# Patient Record
Sex: Female | Born: 1935 | Race: White | Hispanic: No | Smoking: Never smoker
Health system: Southern US, Community
[De-identification: ages and names within clinical notes are randomized; demographics above are authoritative.]

## PROBLEM LIST (undated history)

## (undated) DIAGNOSIS — I10 Essential (primary) hypertension: Secondary | ICD-10-CM

## (undated) DIAGNOSIS — N200 Calculus of kidney: Secondary | ICD-10-CM

## (undated) DIAGNOSIS — B029 Zoster without complications: Secondary | ICD-10-CM

## (undated) DIAGNOSIS — H409 Unspecified glaucoma: Secondary | ICD-10-CM

## (undated) HISTORY — PX: BIOPSY THYROID: PRO38

## (undated) HISTORY — PX: HEMORROIDECTOMY: SUR656

## (undated) HISTORY — PX: OTHER SURGICAL HISTORY: SHX169

## (undated) HISTORY — PX: CYST EXCISION: SHX5701

## (undated) HISTORY — PX: THYROID SURGERY: SHX805

## (undated) HISTORY — PX: ABDOMINAL HYSTERECTOMY: SHX81

## (undated) HISTORY — PX: LUNG BIOPSY: SHX232

## (undated) HISTORY — PX: CATARACT EXTRACTION: SUR2

## (undated) HISTORY — PX: VEIN SURGERY: SHX48

## (undated) HISTORY — PX: CHOLECYSTECTOMY: SHX55

## (undated) HISTORY — PX: PARTIAL HYSTERECTOMY: SHX80

## (undated) HISTORY — PX: ROTATOR CUFF REPAIR: SHX139

---

## 2010-04-11 ENCOUNTER — Inpatient Hospital Stay (HOSPITAL_COMMUNITY)
Admission: EM | Admit: 2010-04-11 | Discharge: 2010-04-15 | DRG: 603 | Disposition: A | Discharge status: Home / Self Care | Payer: Medicare Other | Attending: Internal Medicine | Admitting: Internal Medicine

## 2010-04-11 ENCOUNTER — Inpatient Hospital Stay (HOSPITAL_COMMUNITY): Payer: Medicare Other

## 2010-04-11 DIAGNOSIS — I10 Essential (primary) hypertension: Secondary | ICD-10-CM | POA: Diagnosis present

## 2010-04-11 DIAGNOSIS — T368X5A Adverse effect of other systemic antibiotics, initial encounter: Secondary | ICD-10-CM | POA: Diagnosis present

## 2010-04-11 DIAGNOSIS — E876 Hypokalemia: Secondary | ICD-10-CM | POA: Diagnosis present

## 2010-04-11 DIAGNOSIS — L03319 Cellulitis of trunk, unspecified: Principal | ICD-10-CM | POA: Diagnosis present

## 2010-04-11 DIAGNOSIS — M109 Gout, unspecified: Secondary | ICD-10-CM | POA: Diagnosis not present

## 2010-04-11 DIAGNOSIS — H409 Unspecified glaucoma: Secondary | ICD-10-CM | POA: Diagnosis present

## 2010-04-11 DIAGNOSIS — R232 Flushing: Secondary | ICD-10-CM | POA: Diagnosis present

## 2010-04-11 DIAGNOSIS — L02219 Cutaneous abscess of trunk, unspecified: Principal | ICD-10-CM | POA: Diagnosis present

## 2010-04-11 LAB — BASIC METABOLIC PANEL
Chloride: 104 mEq/L (ref 96–112)
GFR calc Af Amer: 58 mL/min — ABNORMAL LOW (ref >60)
Potassium: 3.3 mEq/L — ABNORMAL LOW (ref 3.5–5.1)
Sodium: 139 mEq/L (ref 135–145)

## 2010-04-11 LAB — CBC
Platelets: 228 10*3/uL (ref 150–400)
RBC: 4.49 MIL/uL (ref 3.87–5.11)
WBC: 9.2 10*3/uL (ref 4.0–10.5)

## 2010-04-11 LAB — DIFFERENTIAL
Basophils Relative: 0 % (ref 0–1)
Eosinophils Absolute: 0.2 10*3/uL (ref 0.0–0.7)
Neutro Abs: 6.4 10*3/uL (ref 1.7–7.7)
Neutrophils Relative %: 70 % (ref 43–77)

## 2010-04-12 ENCOUNTER — Inpatient Hospital Stay (HOSPITAL_COMMUNITY): Payer: Medicare Other

## 2010-04-12 LAB — DIFFERENTIAL
Basophils Relative: 0 % (ref 0–1)
Lymphocytes Relative: 19 % (ref 12–46)
Lymphs Abs: 1.7 10*3/uL (ref 0.7–4.0)
Monocytes Relative: 13 % — ABNORMAL HIGH (ref 3–12)
Neutro Abs: 5.7 10*3/uL (ref 1.7–7.7)
Neutrophils Relative %: 67 % (ref 43–77)

## 2010-04-12 LAB — CBC
Hemoglobin: 11.5 g/dL — ABNORMAL LOW (ref 12.0–15.0)
MCH: 27.6 pg (ref 26.0–34.0)
MCV: 83 fL (ref 78.0–100.0)
RBC: 4.17 MIL/uL (ref 3.87–5.11)

## 2010-04-12 LAB — COMPREHENSIVE METABOLIC PANEL
AST: 15 U/L (ref 0–37)
CO2: 24 mEq/L (ref 19–32)
Chloride: 104 mEq/L (ref 96–112)
Creatinine, Ser: 1.04 mg/dL (ref 0.4–1.2)
GFR calc Af Amer: >60 mL/min (ref >60)
GFR calc non Af Amer: 52 mL/min — ABNORMAL LOW (ref >60)
Total Bilirubin: 0.9 mg/dL (ref 0.3–1.2)

## 2010-04-12 LAB — HEMOGLOBIN A1C: Mean Plasma Glucose: 131 mg/dL — ABNORMAL HIGH (ref <117)

## 2010-04-13 ENCOUNTER — Inpatient Hospital Stay (HOSPITAL_COMMUNITY): Payer: Medicare Other

## 2010-04-13 LAB — DIFFERENTIAL
Eosinophils Absolute: 0.3 10*3/uL (ref 0.0–0.7)
Eosinophils Relative: 5 % (ref 0–5)
Lymphs Abs: 1.6 10*3/uL (ref 0.7–4.0)
Monocytes Relative: 10 % (ref 3–12)

## 2010-04-13 LAB — BASIC METABOLIC PANEL
BUN: 10 mg/dL (ref 6–23)
Creatinine, Ser: 1.08 mg/dL (ref 0.4–1.2)
GFR calc non Af Amer: 49 mL/min — ABNORMAL LOW (ref >60)

## 2010-04-13 LAB — CBC
MCH: 27.4 pg (ref 26.0–34.0)
MCHC: 33 g/dL (ref 30.0–36.0)
MCV: 82.9 fL (ref 78.0–100.0)
Platelets: 170 10*3/uL (ref 150–400)
RDW: 14 % (ref 11.5–15.5)

## 2010-04-13 LAB — BRAIN NATRIURETIC PEPTIDE: Pro B Natriuretic peptide (BNP): 63.5 pg/mL (ref 0.0–100.0)

## 2010-04-18 NOTE — H&P (Signed)
NAME:  April Bailey, April Bailey            ACCOUNT NO.:  [id number]  MEDICAL RECORD NO.:  [id number]           PATIENT TYPE:  E  LOCATION:  [facility]                          FACILITY:  [facility]  PHYSICIAN:  Eduard Clos, MDDATE OF BIRTH:  01/23/1932  DATE OF ADMISSION:  04/11/2010 DATE OF DISCHARGE:  LH                             HISTORY & PHYSICAL   PRIMARY CARE PHYSICIAN:  [facility].  CHIEF COMPLAINT:  Abdominal wall swelling and pain.  HISTORY OF PRESENT ILLNESS:  A 77 year old female with a history of hypertension, glaucoma, has been experiencing some swelling in her abdomen around the umbilicus, infraumbilical, since last 3 days which has been worsening.  The patient denies any trauma or insect bite, has been having some subjective feeling of fever, chills.  Denies any nausea, vomiting.  Denies any deep abdominal pain or diarrhea.  At this time, the patient has been admitted for abdominal wall cellulitis.  During the ER, the patient did develop some reaction to vancomycin for which Benadryl was given and the patient at this time is asymptomatic. The rash was in the face, and as per the nurse the patient was looking flushed.  The patient did not have any swelling of the tongue or difficulty breathing during the reaction.  The patient denies any chest pain, shortness of breath.  Denies any nausea, vomiting, dysuria, discharges, diarrhea, any focal deficit, headache or visual symptoms, hypertension, glaucoma.  PAST SURGICAL HISTORY:  Cholecystectomy.  MEDICATIONS PRIOR TO ADMISSION: 1. Timolol. 2. Maleate eye drops. 3. Travatan eye drops. 4. Benazepril 40 mg daily. 5. Amlodipine 10 mg daily. 6. Vitamin C. 7. Folic acid. 8. B12. 9. Flaxseed oil. 10.Cod liver. 11.Garlic. 12.Potassium chloride. 13.Hydrochlorothiazide 25 mg daily.  ALLERGIES:  IV DYE, IBUPROFEN, and LEVAQUIN.  FAMILY HISTORY:  Nothing contributory.  SOCIAL HISTORY:  The patient lives alone but is  frequented by her daughter and son.  Her daughter was here in the ER along with the patient.  The patient denies smoking cigarettes, drinking alcohol, using illegal drugs.  Full code.  REVIEW OF SYSTEMS:  As per the history of present illness, nothing else significant.  PHYSICAL EXAMINATION:  GENERAL:  The patient examined at bedside not in acute distress. VITAL SIGNS:  Blood pressure is 127/70, pulse 89 per minute, temperature 99.9, respiration is 20 per minute, O2 sat 98%. HEENT:  Anicteric.  No pallor.  No discharge from ears, eyes, nose, or mouth. CHEST:  Bilateral air entry present.  No crepitation. HEART:  S1 and S2 heard. ABDOMEN:  Soft, mild tenderness around the indurated area.  There is a 2- cm duration area in the left infraumbilical area with some erythema extending up to 10 cm in diameter.  I do not see any active discharge at this time.  Bowel sounds are present. CNS:  Alert, awake, oriented to time, place, and person.  Moves upper and lower extremities 5/5. EXTREMITIES:  Peripheral pulses felt.  No edema. LABORATORY DATA:  CBC, WBC 9.2, hemoglobin is 12.5, hematocrit 37.1, platelets 228,000.  Basic metabolic panel, sodium 139, potassium 3.3, chloride 104, carbon dioxide 24, glucose 110, BUN 21, creatinine 1.1, calcium  10.  ASSESSMENT: 1. Abdominal wall cellulitis. 2. Red man reaction to vancomycin. 3. Hypertension. 4. Glaucoma.  PLAN: 1. At this time, admit the patient to medical floor. 2. For abdominal wall cellulitis, we will continue with vancomycin.  I     am going to add Zosyn and clindamycin, get a CT abdomen and pelvis     without contrast to check for developed of abscess or any features     of fascitis.  At that time, we need to consult Surgery. 3. For her red man reaction, we will instruct pharmacy to infuse     vancomycin slowly.  We will give Benadryl 25 before each dose of     vancomycin. 4. Hypertension.  Continue present medication and  further     recommendation as condition evolves.     Eduard Clos, MD     ANK/MEDQ  D:  04/11/2010  T:  04/11/2010  Job:  [id number]  Electronically Signed by Midge Minium MD on 04/18/2010 08:25:54 AM

## 2010-05-04 NOTE — Discharge Summary (Signed)
NAME:  April Bailey, April Bailey            ACCOUNT NO.:  [id number]  MEDICAL RECORD NO.:  [id number]           PATIENT TYPE:  I  LOCATION:  [facility]                          FACILITY:  [facility]  PHYSICIAN:  Jameson Tormey L. Lendell Bailey, MDDATE OF BIRTH:  31-Dec-1932  DATE OF ADMISSION:  04/11/2010 DATE OF DISCHARGE:  03/30/2012LH                              DISCHARGE SUMMARY   DISCHARGE DIAGNOSES: 1. Abdominal wall cellulitis. 2. Acute gout attack of the left foot. 3. Hypertension. 4. Glaucoma. 5. Known history of pulmonary histoplasmosis.  DISCHARGE MEDICATIONS: 1. Doxycycline 100 mg p.o. b.i.d. until gone. 2. Colchicine 1-3 tablets every 2 hours as needed for gout. 3. Imodium as needed for diarrhea. 4. Phenergan 12.5 mg p.o. q.6 h. p.r.n. nausea. 5. Antibacterial ointment to the open area near her umbilicus twice     daily until healed, cover with bandage. 6. Continue amlodipine 10 mg a day. 7. Azopt ophthalmic suspension both eyes twice a day. 8. Benazepril 40 mg a day. 9. Calcium carbonate with vitamin D 1200 mg twice a day. 10.Flaxseed oil twice a day. 11.Folic acid 800 mg a day. 12.Garlic 1000 mg twice a day. 13.Hydrochlorothiazide 25 mg a day. 14.Klor-Con 20 mEq a day. 15.Timolol ophthalmic solution 0.5% every morning. 16.Travatan ophthalmic solution 0.004% 1 drop both eyes nightly. 17.Vitamin B12 daily. 18.Vitamin C 500 mg daily.  CONDITION:  Stable.  ACTIVITY:  Ad lib.  DIET:  Should be heart healthy.  CONSULTATIONS:  None.  PROCEDURES:  None.  FOLLOWUP:  Dr. Jonelle Sports next week.  LABORATORY DATA:  CBC normal.  Basic metabolic panel significant for potassium of 3.3 which was repleted.  Liver function tests unremarkable. Magnesium normal.  Hemoglobin A1c is 6.2.  TSH, free T4, and BNP normal. X-ray of her left foot showed erosions involving the head of the first metatarsal, consider gout, nothing acute.  CT of the abdomen and pelvis without contrast showed periumbilical  cellulitis without definite abscess, prominent common bile duct question due to previous cholecystectomy, hiatal hernia, diverticulosis, question of hepatic cyst, innumerable less than 5 mm diameter nodules at the lung bases.  CT of the chest without contrast showed bilateral innumerable 3-5 mm pulmonary nodules scattered diffusely with some previous scarring.  HISTORY AND HOSPITAL COURSE:  Please see H and P for details.  April Bailey is a pleasant 77 year old white female from [place], [place] who presented to the emergency room with pain and swelling near her umbilicus which had been worsening over the past days.  The patient in the emergency room had a temperature of 99.9, otherwise normal vital signs.  She had an indurated area just left and below umbilicus with erythema extending 10 cm in diameter, no drainage or wound.  She had a normal white count.  She was given vancomycin in the emergency room and had a red man reaction, but no true allergic reaction.  She was placed on ceftaroline and clindamycin.  Her infection did improve and her cellulitis is nearly resolved.  She did develop a small wound just below her umbilicus that was draining serous fluid without any foul odor.  At the time of discharge, there was minimal drainage.  Her cellulitis was nearly resolved and she was feeling much better.  During the hospitalization, she developed left foot erythema, warmth, and exquisite tenderness.  She has history of gout in her hand previously.  She had not injured her foot.  X-ray was consistent with chronic gout.  Her uric acid level was normal.  She is allergic to NONSTEROIDAL ANTI-INFLAMMATORIES.  She was given a couple of doses of colchicine and her pain is much improved.  She had developed some diarrhea after the colchicine and we will give her Imodium.  Her other medical problems remained stable during the hospitalization. Total time on the day of discharge was greater than  30 minutes.     April Hitchman L. Lendell Caprice, MD     CLS/MEDQ  D:  04/15/2010  T:  04/16/2010  Job:  [id number]  cc:   Dr. Jonelle Sports Fax: [phone]  Electronically Signed by Crista Curb MD on 05/04/2010 09:31:54 PM

## 2014-01-17 ENCOUNTER — Encounter (HOSPITAL_COMMUNITY): Payer: Self-pay | Admitting: Emergency Medicine

## 2014-01-17 ENCOUNTER — Emergency Department (HOSPITAL_COMMUNITY): Payer: Medicare Other

## 2014-01-17 ENCOUNTER — Emergency Department (HOSPITAL_COMMUNITY)
Admission: EM | Admit: 2014-01-17 | Discharge: 2014-01-17 | Disposition: A | Discharge status: Home / Self Care | Payer: Medicare Other | Attending: Emergency Medicine | Admitting: Emergency Medicine

## 2014-01-17 DIAGNOSIS — R51 Headache: Secondary | ICD-10-CM | POA: Insufficient documentation

## 2014-01-17 DIAGNOSIS — Z8669 Personal history of other diseases of the nervous system and sense organs: Secondary | ICD-10-CM | POA: Insufficient documentation

## 2014-01-17 DIAGNOSIS — Z79899 Other long term (current) drug therapy: Secondary | ICD-10-CM | POA: Diagnosis not present

## 2014-01-17 DIAGNOSIS — R519 Headache, unspecified: Secondary | ICD-10-CM

## 2014-01-17 DIAGNOSIS — Z87442 Personal history of urinary calculi: Secondary | ICD-10-CM | POA: Diagnosis not present

## 2014-01-17 DIAGNOSIS — I10 Essential (primary) hypertension: Secondary | ICD-10-CM | POA: Insufficient documentation

## 2014-01-17 HISTORY — DX: Essential (primary) hypertension: I10

## 2014-01-17 HISTORY — DX: Unspecified glaucoma: H40.9

## 2014-01-17 HISTORY — DX: Calculus of kidney: N20.0

## 2014-01-17 LAB — URINE MICROSCOPIC-ADD ON

## 2014-01-17 LAB — BASIC METABOLIC PANEL
ANION GAP: 7 (ref 5–15)
BUN: 18 mg/dL (ref 6–23)
CHLORIDE: 105 meq/L (ref 96–112)
CO2: 24 mmol/L (ref 19–32)
Calcium: 10.1 mg/dL (ref 8.4–10.5)
Creatinine, Ser: 1.05 mg/dL (ref 0.50–1.10)
GFR, EST AFRICAN AMERICAN: 57 mL/min — AB (ref >90)
GFR, EST NON AFRICAN AMERICAN: 49 mL/min — AB (ref >90)
Glucose, Bld: 139 mg/dL — ABNORMAL HIGH (ref 70–99)
POTASSIUM: 3.3 mmol/L — AB (ref 3.5–5.1)
SODIUM: 136 mmol/L (ref 135–145)

## 2014-01-17 LAB — CBC WITH DIFFERENTIAL/PLATELET
BASOS PCT: 0 % (ref 0–1)
Basophils Absolute: 0 10*3/uL (ref 0.0–0.1)
EOS ABS: 0.2 10*3/uL (ref 0.0–0.7)
Eosinophils Relative: 2 % (ref 0–5)
HCT: 38.5 % (ref 36.0–46.0)
Hemoglobin: 12.5 g/dL (ref 12.0–15.0)
LYMPHS ABS: 1.1 10*3/uL (ref 0.7–4.0)
Lymphocytes Relative: 12 % (ref 12–46)
MCH: 26.9 pg (ref 26.0–34.0)
MCHC: 32.5 g/dL (ref 30.0–36.0)
MCV: 82.8 fL (ref 78.0–100.0)
Monocytes Absolute: 0.6 10*3/uL (ref 0.1–1.0)
Monocytes Relative: 7 % (ref 3–12)
NEUTROS ABS: 7.5 10*3/uL (ref 1.7–7.7)
NEUTROS PCT: 79 % — AB (ref 43–77)
PLATELETS: 248 10*3/uL (ref 150–400)
RBC: 4.65 MIL/uL (ref 3.87–5.11)
RDW: 14.8 % (ref 11.5–15.5)
WBC: 9.4 10*3/uL (ref 4.0–10.5)

## 2014-01-17 LAB — URINALYSIS, ROUTINE W REFLEX MICROSCOPIC
Bilirubin Urine: NEGATIVE
GLUCOSE, UA: NEGATIVE mg/dL
Hgb urine dipstick: NEGATIVE
Ketones, ur: NEGATIVE mg/dL
Nitrite: NEGATIVE
PROTEIN: NEGATIVE mg/dL
SPECIFIC GRAVITY, URINE: 1.01 (ref 1.005–1.030)
UROBILINOGEN UA: 0.2 mg/dL (ref 0.0–1.0)
pH: 6 (ref 5.0–8.0)

## 2014-01-17 LAB — SEDIMENTATION RATE: SED RATE: 8 mm/h (ref 0–22)

## 2014-01-17 MED ORDER — POTASSIUM CHLORIDE CRYS ER 20 MEQ PO TBCR
40.0000 meq | EXTENDED_RELEASE_TABLET | Freq: Once | ORAL | Status: AC
  Administered 2014-01-17: 40 meq via ORAL
  Filled 2014-01-17: qty 2

## 2014-01-17 MED ORDER — HYDROCODONE-ACETAMINOPHEN 5-325 MG PO TABS: 1.0000 | ORAL_TABLET | ORAL | Status: DC | PRN

## 2014-01-17 MED ORDER — MORPHINE SULFATE 4 MG/ML IJ SOLN
4.0000 mg | Freq: Once | INTRAMUSCULAR | Status: AC
  Administered 2014-01-17: 4 mg via INTRAVENOUS
  Filled 2014-01-17: qty 1

## 2014-01-17 MED ORDER — ONDANSETRON HCL 4 MG/2ML IJ SOLN
4.0000 mg | Freq: Once | INTRAMUSCULAR | Status: AC
  Administered 2014-01-17: 4 mg via INTRAMUSCULAR
  Filled 2014-01-17: qty 2

## 2014-01-17 MED ORDER — MORPHINE SULFATE 4 MG/ML IJ SOLN
2.0000 mg | Freq: Once | INTRAMUSCULAR | Status: AC
  Administered 2014-01-17: 2 mg via INTRAVENOUS
  Filled 2014-01-17: qty 1

## 2014-01-17 MED ORDER — METOCLOPRAMIDE HCL 5 MG/ML IJ SOLN
5.0000 mg | Freq: Once | INTRAMUSCULAR | Status: AC
  Administered 2014-01-17: 5 mg via INTRAVENOUS
  Filled 2014-01-17: qty 2

## 2014-01-17 NOTE — ED Notes (Signed)
Rn called CT, Peyton Najjar and requested copy of CT disc and report for pt.

## 2014-01-17 NOTE — Discharge Instructions (Signed)

## 2014-01-17 NOTE — ED Notes (Signed)
Patient c/o headache that started "a little" before Christmas. Patient states "It let up some yesterday" but now reports that it has become severe. Denies any blurred vision, dizziness, weakness, or confusion. Patient reports taking 2 tylenol with no relief. Patient also states that she took aspirin this morning at 1:15 with no relief.

## 2014-01-17 NOTE — ED Provider Notes (Signed)
CSN: [id number]     Arrival date & time 01/17/14  0831 History   First MD Initiated Contact with Patient 01/17/14 [phone]     Chief Complaint  Patient presents with  . Headache     (Consider location/radiation/quality/duration/timing/severity/associated sxs/prior Treatment) The history is provided by the patient and a relative.   April Bailey is a 81 y.o. female presenting with a waxing and waning headache starting just before Christmas, so now present for at least 10 days.  She describes a deep constant throbbing pain which started in her occipital area, radiates up and around and now involving her bilateral forehead and temples as well.  She denies nausea, vomiting, dizziness, visual changes, stiff neck, fevers and focal weakness.  She has taken tylenol extra strength pain relievers with mild improvement.  Last night the headache became more severe preventing her from sleeping. Her headache is worse when lying down.  She took aspirin around 1 am today and stayed up in her living room chair, getting little sleep last night.  She denies recent uri's, sinus issues and denies injury or falls.  Her past medical history is significant for hypertension and glaucoma.  She denies eye pain and visual changes.    Past Medical History  Diagnosis Date  . Hypertension   . Glaucoma   . Kidney stones    Past Surgical History  Procedure Laterality Date  . Thyroid surgery    . Kidney stones    . Hemorroidectomy    . Rotator cuff repair    . Cyst excision    . Cholecystectomy    . Lung biopsy Right   . Cataract extraction    . Partial hysterectomy    . Biopsy thyroid    . Vein surgery    . Abdominal hysterectomy     Family History  Problem Relation Age of Onset  . Anuerysm Father    History  Substance Use Topics  . Smoking status: Never Smoker   . Smokeless tobacco: Never Used  . Alcohol Use: No   OB History    Gravida Para Term Preterm AB TAB SAB Ectopic Multiple Living   Review of Systems  Constitutional: Negative for fever.  HENT: Negative for congestion and sore throat.   Eyes: Negative.   Respiratory: Negative for chest tightness and shortness of breath.   Cardiovascular: Negative for chest pain.  Gastrointestinal: Negative for nausea and abdominal pain.  Genitourinary: Negative.   Musculoskeletal: Negative for joint swelling, arthralgias and neck pain.  Skin: Negative.  Negative for rash and wound.  Neurological: Positive for headaches. Negative for dizziness, speech difficulty, weakness, light-headedness and numbness.  Psychiatric/Behavioral: Negative.       Allergies  Ibuprofen; Iohexol; Levaquin; and Vancomycin  Home Medications   Prior to Admission medications   Medication Sig Start Date End Date Taking? Authorizing Provider  amLODipine (NORVASC) 10 MG tablet Take 10 mg by mouth daily. 12/08/13  Yes Historical Provider, MD  AZOPT 1 % ophthalmic suspension Place 1 drop into both eyes 2 (two) times daily. 10/31/13  Yes Historical Provider, MD  benazepril (LOTENSIN) 40 MG tablet Take 40 mg by mouth daily. 12/08/13  Yes Historical Provider, MD  Flaxseed, Linseed, (FLAX SEED OIL PO) Take 1,000 mg by mouth daily.   Yes Historical Provider, MD  folic acid (FOLVITE) 800 MCG tablet Take 400 mcg by mouth daily.   Yes Historical Provider, MD  Garlic Oil (ODORLESS GARLIC) 1000 MG CAPS Take 1,000 mg by mouth 2 (two) times daily.   Yes Historical Provider, MD  hydrochlorothiazide (HYDRODIURIL) 25 MG tablet Take 25 mg by mouth daily.  12/27/13  Yes Historical Provider, MD  KLOR-CON M20 20 MEQ tablet Take 20 mEq by mouth daily. as directed 11/29/13  Yes Historical Provider, MD  timolol (TIMOPTIC) 0.5 % ophthalmic solution Place 1 drop into both eyes every evening. 12/17/13  Yes Historical Provider, MD  TRAVATAN Z 0.004 % SOLN ophthalmic solution Place 1 drop into both eyes daily. 10/21/13  Yes Historical Provider, MD  vitamin B-12 (CYANOCOBALAMIN) 500  MCG tablet Take 500 mcg by mouth daily.   Yes Historical Provider, MD  vitamin C (ASCORBIC ACID) 500 MG tablet Take 500 mg by mouth daily.   Yes Historical Provider, MD  HYDROcodone-acetaminophen (NORCO/VICODIN) 5-325 MG per tablet Take 1 tablet by mouth every 4 (four) hours as needed. 01/17/14   Burgess Amor, PA-C   BP 119/62 mmHg  Pulse 69  Temp(Src) 97.7 F (36.5 C) (Oral)  Resp 18  Ht  (1.575 m)  Wt 150 lb (68.04 kg)  BMI 27.43 kg/m2  SpO2 97% Physical Exam  Constitutional: She is oriented to person, place, and time. She appears well-developed and well-nourished.  Uncomfortable appearing  HENT:  Head: Normocephalic and atraumatic.  Mouth/Throat: Oropharynx is clear and moist.  Eyes: EOM are normal. Pupils are equal, round, and reactive to light.  Neck: Normal range of motion. Neck supple.  Cardiovascular: Normal rate and normal heart sounds.   Pulmonary/Chest: Effort normal.  Abdominal: Soft. There is no tenderness.  Musculoskeletal: Normal range of motion.  Lymphadenopathy:    She has no cervical adenopathy.  Neurological: She is alert and oriented to person, place, and time. She has normal strength. No sensory deficit. Gait normal. GCS eye subscore is 4. GCS verbal subscore is 5. GCS motor subscore is 6.  Gait normal, normal rapid alternating movements. Cranial nerves III-XII intact.  No pronator drift.  Skin: Skin is warm and dry. No rash noted.  Psychiatric: She has a normal mood and affect. Her speech is normal and behavior is normal. Thought content normal. Cognition and memory are normal.  Nursing note and vitals reviewed.   ED Course  Procedures (including critical care time) Labs Review Labs Reviewed  BASIC METABOLIC PANEL - Abnormal; Notable for the following:    Potassium 3.3 (*)    Glucose, Bld 139 (*)    GFR calc non Af Amer 49 (*)    GFR calc Af Amer 57 (*)    All other components within normal limits  CBC WITH DIFFERENTIAL - Abnormal; Notable for the  following:    Neutrophils Relative % 79 (*)    All other components within normal limits  URINALYSIS, ROUTINE W REFLEX MICROSCOPIC - Abnormal; Notable for the following:    Leukocytes, UA TRACE (*)    All other components within normal limits  URINE MICROSCOPIC-ADD ON - Abnormal; Notable for the following:    Squamous Epithelial / LPF FEW (*)    All other components within normal limits  SEDIMENTATION RATE    Imaging Review Ct Head Wo Contrast  01/17/2014   CLINICAL DATA:  Posterior headache since 01/08/2014  EXAM: CT HEAD WITHOUT CONTRAST  TECHNIQUE: Contiguous axial images were obtained from the base of the skull through the vertex without intravenous contrast.  COMPARISON:  None.  FINDINGS: The brain shows mild age appropriate volume loss. No sign  of old or acute focal infarction, mass lesion, hemorrhage, hydrocephalus or extra-axial collection. No calvarial abnormality. No fluid in the sinuses, middle ears or mastoids. Retention cyst in the left maxillary sinus. There is atherosclerotic calcification of the major vessels at the base of the brain.  IMPRESSION: No cause of headache identified. No acute finding. Mild brain atrophy, typical for age.   Electronically Signed   By: Paulina Fusi M.D.   On: 01/17/2014 10:14     EKG Interpretation None       Medications  morphine 4 MG/ML injection 2 mg (2 mg Intravenous Given 01/17/14 0922)  ondansetron (ZOFRAN) injection 4 mg (4 mg Intramuscular Given 01/17/14 0923)  morphine 4 MG/ML injection 2 mg (2 mg Intravenous Given 01/17/14 1004)  metoCLOPramide (REGLAN) injection 5 mg (5 mg Intravenous Given 01/17/14 1035)  potassium chloride SA (K-DUR,KLOR-CON) CR tablet 40 mEq (40 mEq Oral Given 01/17/14 1145)  morphine 4 MG/ML injection 4 mg (4 mg Intravenous Given 01/17/14 1151)     MDM   Final diagnoses:  Headache    Patients labs and/or radiological studies were viewed and considered during the medical decision making and disposition process.  Pt  given morphine and reglan injections with improving headache pain at time of dispo.  She was advised f/u with her pcp for a recheck this week if her symptoms persist.  Ct negative.  Neuro exam negative.  Pt was seen by Dr Ethelda Chick during this visit.  Discussed lab results including slightly low potassium which was replaced. Advised glucose slightly elevated.  Plan f/u with pcp this week for recheck.  The patient appears reasonably screened and/or stabilized for discharge and I doubt any other medical condition or other [facility] requiring further screening, evaluation, or treatment in the ED at this time prior to discharge.   Burgess Amor, PA-C 01/17/14 1733  Doug Sou, MD 01/18/14 [phone]

## 2014-01-17 NOTE — ED Provider Notes (Signed)
Complains of occipital headache, nonradiating gradual onset one or 2 days before Christmas 2015. Initially treated herself with Tylenol with partial relief. She denies fever denies trauma denies other associated symptoms on exam no distress Glasgow Coma Score 15 HEENT exam no facial asymmetry neck supple no signs of meningitis neurologic cranial nerves II through XII intact gait normal Romberg normal pronator drift normal  Doug Sou, MD 01/17/14 1032

## 2014-01-29 ENCOUNTER — Emergency Department (HOSPITAL_COMMUNITY)
Admission: EM | Admit: 2014-01-29 | Discharge: 2014-01-29 | Disposition: A | Discharge status: Home / Self Care | Payer: Medicare Other | Attending: Emergency Medicine | Admitting: Emergency Medicine

## 2014-01-29 ENCOUNTER — Emergency Department (HOSPITAL_COMMUNITY): Payer: Medicare Other

## 2014-01-29 ENCOUNTER — Encounter (HOSPITAL_COMMUNITY): Payer: Self-pay | Admitting: Emergency Medicine

## 2014-01-29 DIAGNOSIS — H409 Unspecified glaucoma: Secondary | ICD-10-CM | POA: Diagnosis not present

## 2014-01-29 DIAGNOSIS — M25521 Pain in right elbow: Secondary | ICD-10-CM | POA: Diagnosis not present

## 2014-01-29 DIAGNOSIS — R05 Cough: Secondary | ICD-10-CM

## 2014-01-29 DIAGNOSIS — R21 Rash and other nonspecific skin eruption: Secondary | ICD-10-CM | POA: Insufficient documentation

## 2014-01-29 DIAGNOSIS — Z9841 Cataract extraction status, right eye: Secondary | ICD-10-CM | POA: Insufficient documentation

## 2014-01-29 DIAGNOSIS — I10 Essential (primary) hypertension: Secondary | ICD-10-CM | POA: Insufficient documentation

## 2014-01-29 DIAGNOSIS — R531 Weakness: Secondary | ICD-10-CM

## 2014-01-29 DIAGNOSIS — M545 Low back pain: Secondary | ICD-10-CM | POA: Diagnosis not present

## 2014-01-29 DIAGNOSIS — R52 Pain, unspecified: Secondary | ICD-10-CM | POA: Diagnosis present

## 2014-01-29 DIAGNOSIS — Z87442 Personal history of urinary calculi: Secondary | ICD-10-CM | POA: Insufficient documentation

## 2014-01-29 DIAGNOSIS — Z79899 Other long term (current) drug therapy: Secondary | ICD-10-CM | POA: Diagnosis not present

## 2014-01-29 DIAGNOSIS — M79651 Pain in right thigh: Secondary | ICD-10-CM | POA: Diagnosis not present

## 2014-01-29 DIAGNOSIS — R059 Cough, unspecified: Secondary | ICD-10-CM

## 2014-01-29 LAB — CBC WITH DIFFERENTIAL/PLATELET
Basophils Absolute: 0 10*3/uL (ref 0.0–0.1)
Basophils Relative: 0 % (ref 0–1)
EOS ABS: 0.1 10*3/uL (ref 0.0–0.7)
EOS PCT: 0 % (ref 0–5)
HEMATOCRIT: 32 % — AB (ref 36.0–46.0)
HEMOGLOBIN: 10.5 g/dL — AB (ref 12.0–15.0)
LYMPHS ABS: 0.8 10*3/uL (ref 0.7–4.0)
LYMPHS PCT: 6 % — AB (ref 12–46)
MCH: 27.1 pg (ref 26.0–34.0)
MCHC: 32.8 g/dL (ref 30.0–36.0)
MCV: 82.7 fL (ref 78.0–100.0)
MONO ABS: 0.8 10*3/uL (ref 0.1–1.0)
MONOS PCT: 6 % (ref 3–12)
Neutro Abs: 12.1 10*3/uL — ABNORMAL HIGH (ref 1.7–7.7)
Neutrophils Relative %: 88 % — ABNORMAL HIGH (ref 43–77)
Platelets: 248 10*3/uL (ref 150–400)
RBC: 3.87 MIL/uL (ref 3.87–5.11)
RDW: 14.4 % (ref 11.5–15.5)
WBC: 13.8 10*3/uL — AB (ref 4.0–10.5)

## 2014-01-29 LAB — COMPREHENSIVE METABOLIC PANEL
ALBUMIN: 3.7 g/dL (ref 3.5–5.2)
ALT: 26 U/L (ref 0–35)
AST: 17 U/L (ref 0–37)
Alkaline Phosphatase: 73 U/L (ref 39–117)
Anion gap: 8 (ref 5–15)
BUN: 23 mg/dL (ref 6–23)
CHLORIDE: 102 meq/L (ref 96–112)
CO2: 26 mmol/L (ref 19–32)
Calcium: 9.7 mg/dL (ref 8.4–10.5)
Creatinine, Ser: 1.23 mg/dL — ABNORMAL HIGH (ref 0.50–1.10)
GFR calc Af Amer: 47 mL/min — ABNORMAL LOW (ref >90)
GFR, EST NON AFRICAN AMERICAN: 41 mL/min — AB (ref >90)
Glucose, Bld: 154 mg/dL — ABNORMAL HIGH (ref 70–99)
POTASSIUM: 4 mmol/L (ref 3.5–5.1)
SODIUM: 136 mmol/L (ref 135–145)
Total Bilirubin: 0.9 mg/dL (ref 0.3–1.2)
Total Protein: 6.9 g/dL (ref 6.0–8.3)

## 2014-01-29 LAB — URINALYSIS, ROUTINE W REFLEX MICROSCOPIC
Bilirubin Urine: NEGATIVE
Glucose, UA: NEGATIVE mg/dL
Hgb urine dipstick: NEGATIVE
Ketones, ur: NEGATIVE mg/dL
Leukocytes, UA: NEGATIVE
Nitrite: NEGATIVE
PH: 6.5 (ref 5.0–8.0)
Specific Gravity, Urine: 1.01 (ref 1.005–1.030)
Urobilinogen, UA: 0.2 mg/dL (ref 0.0–1.0)

## 2014-01-29 LAB — URINE MICROSCOPIC-ADD ON

## 2014-01-29 MED ORDER — TRAMADOL HCL 50 MG PO TABS: 50.0000 mg | ORAL_TABLET | Freq: Four times a day (QID) | ORAL | Status: DC | PRN

## 2014-01-29 MED ORDER — METHYLPREDNISOLONE SODIUM SUCC 125 MG IJ SOLR
125.0000 mg | Freq: Once | INTRAMUSCULAR | Status: AC
  Administered 2014-01-29: 125 mg via INTRAMUSCULAR
  Filled 2014-01-29: qty 2

## 2014-01-29 MED ORDER — HYDROCODONE-ACETAMINOPHEN 5-325 MG PO TABS
1.0000 | ORAL_TABLET | Freq: Once | ORAL | Status: AC
  Administered 2014-01-29: 1 via ORAL
  Filled 2014-01-29: qty 1

## 2014-01-29 MED ORDER — AZITHROMYCIN 250 MG PO TABS: ORAL_TABLET | ORAL | Status: DC

## 2014-01-29 NOTE — ED Notes (Signed)
Patient requesting pain medication prior to leaving. MD made aware and verbal order for 1 tab of Vicodin 5-325 mg PO obtained.

## 2014-01-29 NOTE — ED Notes (Addendum)
Pt reports generalized body aches,weakness since Tuesday. Pt denies any recent fall/injury. Pt reports nausea and intermittent diarrhea. Pt denies any dizziness or black stools.pt reports started abx x1 week ago for URI. Pt reports felt the same when discovered her levaquin allergy.

## 2014-01-29 NOTE — ED Notes (Signed)
Patient with no complaints at this time. Respirations even and unlabored. Skin warm/dry. Discharge instructions reviewed with patient at this time. Patient given opportunity to voice concerns/ask questions. Patient discharged at this time and left Emergency Department via wheelchair. 

## 2014-01-29 NOTE — ED Provider Notes (Signed)
CSN: [id number]     Arrival date & time 01/29/14  1000 History  This chart was scribed for Benny Lennert, MD by Ronney Lion, ED Scribe. This patient was seen in room APA05/APA05 and the patient's care was started at 10:25 AM.    Chief Complaint  Patient presents with  . Generalized Body Aches   The history is provided by the patient and a relative. No language interpreter was used.     HPI Comments: Cinda Hara is a 81 y.o. female who presents to the Emergency Department complaining of constant generalized myalgia, including lower back pain radiating to her right knee and right arm pain from her elbow to fingertips, with onset 2 days ago. Patient's daughter also noticed a new rash throughout her body. Patient was recently placed on antibiotics for a URI. She has known allergies to Levaquin, and per nursing notes, she had the same symptoms when she first discovered her allergy to Levaquin. Patient states that weight-bearing exacerbates the pain. Patient normally takes Tylenol arthritis for her arthritic pain.    Past Medical History  Diagnosis Date  . Hypertension   . Glaucoma   . Kidney stones    Past Surgical History  Procedure Laterality Date  . Thyroid surgery    . Kidney stones    . Hemorroidectomy    . Rotator cuff repair    . Cyst excision    . Cholecystectomy    . Lung biopsy Right   . Cataract extraction    . Partial hysterectomy    . Biopsy thyroid    . Vein surgery    . Abdominal hysterectomy     Family History  Problem Relation Age of Onset  . Anuerysm Father    History  Substance Use Topics  . Smoking status: Never Smoker   . Smokeless tobacco: Never Used  . Alcohol Use: No   OB History    Gravida Para Term Preterm AB TAB SAB Ectopic Multiple Living   Review of Systems  Constitutional: Negative for appetite change.  HENT: Negative for congestion, ear discharge and sinus pressure.   Eyes: Negative for discharge.  Respiratory:  Negative for cough.   Cardiovascular: Negative for chest pain.  Gastrointestinal: Negative for abdominal pain.  Genitourinary: Negative for frequency and hematuria.  Musculoskeletal: Positive for myalgias, back pain and arthralgias.  Skin: Positive for rash.  Neurological: Positive for weakness. Negative for seizures and headaches.  Psychiatric/Behavioral: Negative for hallucinations.      Allergies  Ibuprofen; Iohexol; Levaquin; and Vancomycin  Home Medications   Prior to Admission medications   Medication Sig Start Date End Date Taking? Authorizing Provider  amLODipine (NORVASC) 10 MG tablet Take 10 mg by mouth daily. 12/08/13   Historical Provider, MD  AZOPT 1 % ophthalmic suspension Place 1 drop into both eyes 2 (two) times daily. 10/31/13   Historical Provider, MD  benazepril (LOTENSIN) 40 MG tablet Take 40 mg by mouth daily. 12/08/13   Historical Provider, MD  Flaxseed, Linseed, (FLAX SEED OIL PO) Take 1,000 mg by mouth daily.    Historical Provider, MD  folic acid (FOLVITE) 800 MCG tablet Take 400 mcg by mouth daily.    Historical Provider, MD  Garlic Oil (ODORLESS GARLIC) 1000 MG CAPS Take 1,000 mg by mouth 2 (two) times daily.    Historical Provider, MD  hydrochlorothiazide (HYDRODIURIL) 25 MG tablet Take 25 mg by mouth daily.  12/27/13   Historical Provider, MD  HYDROcodone-acetaminophen (NORCO/VICODIN) 5-325 MG per tablet Take 1 tablet by mouth every 4 (four) hours as needed. 01/17/14   Burgess AmorJulie Idol, PA-C  KLOR-CON M20 20 MEQ tablet Take 20 mEq by mouth daily. as directed 11/29/13   Historical Provider, MD  timolol (TIMOPTIC) 0.5 % ophthalmic solution Place 1 drop into both eyes every evening. 12/17/13   Historical Provider, MD  TRAVATAN Z 0.004 % SOLN ophthalmic solution Place 1 drop into both eyes daily. 10/21/13   Historical Provider, MD  vitamin B-12 (CYANOCOBALAMIN) 500 MCG tablet Take 500 mcg by mouth daily.    Historical Provider, MD  vitamin C (ASCORBIC ACID) 500 MG tablet  Take 500 mg by mouth daily.    Historical Provider, MD   BP 114/56 mmHg  Pulse 83  Temp(Src) 100.6 F (38.1 C) (Oral)  Resp 18  Ht 5\' 2"  (1.575 m)  Wt 149 lb (67.586 kg)  BMI 27.25 kg/m2  SpO2 95% Physical Exam  Constitutional: She is oriented to person, place, and time. She appears well-developed.  HENT:  Head: Normocephalic.  Eyes: Conjunctivae and EOM are normal. No scleral icterus.  Neck: Neck supple. No thyromegaly present.  Cardiovascular: Normal rate and regular rhythm.  Exam reveals no gallop and no friction rub.   No murmur heard. Pulmonary/Chest: No stridor. She has no wheezes. She has no rales. She exhibits no tenderness.  Abdominal: She exhibits no distension. There is no tenderness. There is no rebound.  Musculoskeletal: Normal range of motion. She exhibits tenderness. She exhibits no edema.  Tenderness to lumbar spine, right thigh, and right elbow.  Lymphadenopathy:    She has no cervical adenopathy.  Neurological: She is oriented to person, place, and time. She exhibits normal muscle tone. Coordination normal.  Skin: Rash noted. No erythema.  Maculopapapular rash throughout her body.  Psychiatric: She has a normal mood and affect. Her behavior is normal.  Nursing note and vitals reviewed.   ED Course  Procedures (including critical care time)  DIAGNOSTIC STUDIES: Oxygen Saturation is 95% on room air, normal by my interpretation.    COORDINATION OF CARE: 10:27 AM - Discussed treatment plan with pt at bedside which includes tests and Xrs and pt agreed to plan.   Labs Review Labs Reviewed - No data to display  Imaging Review No results found.   EKG Interpretation None      MDM   Final diagnoses:  None   Uri,   Rash probably from amox,   Myalgias,   tx with zpack and ultram and follow up with pcp   The chart was scribed for me under my direct supervision.  I personally performed the history, physical, and medical decision making and all procedures  in the evaluation of this patient.Benny Lennert.    Dezire Turk L Kanye Depree, MD 01/29/14 [id number]

## 2014-01-29 NOTE — Discharge Instructions (Signed)
Take benadryl for rash and itching and follow up with your md next week for recheck

## 2014-01-29 NOTE — ED Notes (Signed)
PT c/o lower back pain and pain that radiates into her right leg. PT on amoxicillin started last Wednesday for URI ordered by her PCP. Some red rash noted over body and pt questioning reaction from antibiotics.

## 2014-01-29 NOTE — ED Notes (Signed)
Bertram DenverVickey Roslyn Else EMT and Tori NT did in/out cath.

## 2014-01-29 NOTE — ED Notes (Signed)
MD at bedside. 

## 2015-05-09 ENCOUNTER — Encounter (HOSPITAL_COMMUNITY): Payer: Self-pay | Admitting: Emergency Medicine

## 2015-05-09 ENCOUNTER — Emergency Department (HOSPITAL_COMMUNITY): Payer: Medicare Other

## 2015-05-09 ENCOUNTER — Inpatient Hospital Stay (HOSPITAL_COMMUNITY)
Admission: EM | Admit: 2015-05-09 | Discharge: 2015-05-11 | DRG: 202 | Disposition: A | Discharge status: Home / Self Care | Payer: Medicare Other | Attending: Internal Medicine | Admitting: Internal Medicine

## 2015-05-09 DIAGNOSIS — I129 Hypertensive chronic kidney disease with stage 1 through stage 4 chronic kidney disease, or unspecified chronic kidney disease: Secondary | ICD-10-CM | POA: Diagnosis present

## 2015-05-09 DIAGNOSIS — H409 Unspecified glaucoma: Secondary | ICD-10-CM | POA: Diagnosis present

## 2015-05-09 DIAGNOSIS — R05 Cough: Secondary | ICD-10-CM | POA: Diagnosis not present

## 2015-05-09 DIAGNOSIS — D72829 Elevated white blood cell count, unspecified: Secondary | ICD-10-CM | POA: Diagnosis present

## 2015-05-09 DIAGNOSIS — I10 Essential (primary) hypertension: Secondary | ICD-10-CM | POA: Diagnosis not present

## 2015-05-09 DIAGNOSIS — J209 Acute bronchitis, unspecified: Principal | ICD-10-CM | POA: Diagnosis present

## 2015-05-09 DIAGNOSIS — R0602 Shortness of breath: Secondary | ICD-10-CM

## 2015-05-09 DIAGNOSIS — J40 Bronchitis, not specified as acute or chronic: Secondary | ICD-10-CM | POA: Diagnosis not present

## 2015-05-09 DIAGNOSIS — E860 Dehydration: Secondary | ICD-10-CM | POA: Diagnosis present

## 2015-05-09 DIAGNOSIS — N183 Chronic kidney disease, stage 3 unspecified: Secondary | ICD-10-CM | POA: Diagnosis present

## 2015-05-09 DIAGNOSIS — Z7982 Long term (current) use of aspirin: Secondary | ICD-10-CM

## 2015-05-09 DIAGNOSIS — J9801 Acute bronchospasm: Secondary | ICD-10-CM | POA: Diagnosis not present

## 2015-05-09 DIAGNOSIS — N179 Acute kidney failure, unspecified: Secondary | ICD-10-CM | POA: Diagnosis present

## 2015-05-09 LAB — CBC WITH DIFFERENTIAL/PLATELET
Basophils Absolute: 0 10*3/uL (ref 0.0–0.1)
Basophils Relative: 0 %
Eosinophils Absolute: 0.6 10*3/uL (ref 0.0–0.7)
Eosinophils Relative: 6 %
HEMATOCRIT: 40.6 % (ref 36.0–46.0)
HEMOGLOBIN: 13.7 g/dL (ref 12.0–15.0)
LYMPHS ABS: 2.6 10*3/uL (ref 0.7–4.0)
LYMPHS PCT: 24 %
MCH: 27.5 pg (ref 26.0–34.0)
MCHC: 33.7 g/dL (ref 30.0–36.0)
MCV: 81.4 fL (ref 78.0–100.0)
MONOS PCT: 8 %
Monocytes Absolute: 0.9 10*3/uL (ref 0.1–1.0)
NEUTROS ABS: 6.5 10*3/uL (ref 1.7–7.7)
NEUTROS PCT: 62 %
PLATELETS: 286 10*3/uL (ref 150–400)
RBC: 4.99 MIL/uL (ref 3.87–5.11)
RDW: 13.6 % (ref 11.5–15.5)
WBC: 10.6 10*3/uL — AB (ref 4.0–10.5)

## 2015-05-09 LAB — COMPREHENSIVE METABOLIC PANEL
ALBUMIN: 4.6 g/dL (ref 3.5–5.0)
ALT: 21 U/L (ref 14–54)
AST: 26 U/L (ref 15–41)
Alkaline Phosphatase: 80 U/L (ref 38–126)
Anion gap: 10 (ref 5–15)
BUN: 24 mg/dL — ABNORMAL HIGH (ref 6–20)
CHLORIDE: 106 mmol/L (ref 101–111)
CO2: 26 mmol/L (ref 22–32)
CREATININE: 1.29 mg/dL — AB (ref 0.44–1.00)
Calcium: 10.6 mg/dL — ABNORMAL HIGH (ref 8.9–10.3)
GFR calc non Af Amer: 38 mL/min — ABNORMAL LOW (ref >60)
GFR, EST AFRICAN AMERICAN: 44 mL/min — AB (ref >60)
GLUCOSE: 123 mg/dL — AB (ref 65–99)
Potassium: 3.6 mmol/L (ref 3.5–5.1)
SODIUM: 142 mmol/L (ref 135–145)
Total Bilirubin: 0.6 mg/dL (ref 0.3–1.2)
Total Protein: 8 g/dL (ref 6.5–8.1)

## 2015-05-09 LAB — TROPONIN I: Troponin I: <0.03 ng/mL (ref <0.031)

## 2015-05-09 LAB — LACTIC ACID, PLASMA
LACTIC ACID, VENOUS: 1.8 mmol/L (ref 0.5–2.0)
LACTIC ACID, VENOUS: 5.6 mmol/L — AB (ref 0.5–2.0)

## 2015-05-09 LAB — BRAIN NATRIURETIC PEPTIDE: B Natriuretic Peptide: 42 pg/mL (ref 0.0–100.0)

## 2015-05-09 MED ORDER — AMLODIPINE BESYLATE 5 MG PO TABS
10.0000 mg | ORAL_TABLET | Freq: Every evening | ORAL | Status: DC
  Administered 2015-05-09 – 2015-05-10 (×2): 10 mg via ORAL
  Filled 2015-05-09 (×2): qty 2

## 2015-05-09 MED ORDER — ENOXAPARIN SODIUM 40 MG/0.4ML ~~LOC~~ SOLN
40.0000 mg | SUBCUTANEOUS | Status: DC
  Administered 2015-05-09: 40 mg via SUBCUTANEOUS
  Filled 2015-05-09: qty 0.4

## 2015-05-09 MED ORDER — METHYLPREDNISOLONE SODIUM SUCC 125 MG IJ SOLR
125.0000 mg | Freq: Once | INTRAMUSCULAR | Status: AC
  Administered 2015-05-09: 125 mg via INTRAVENOUS
  Filled 2015-05-09: qty 2

## 2015-05-09 MED ORDER — ACETAMINOPHEN 325 MG PO TABS: 650.0000 mg | ORAL_TABLET | Freq: Four times a day (QID) | ORAL | Status: DC | PRN

## 2015-05-09 MED ORDER — IPRATROPIUM-ALBUTEROL 0.5-2.5 (3) MG/3ML IN SOLN
3.0000 mL | Freq: Four times a day (QID) | RESPIRATORY_TRACT | Status: DC
  Administered 2015-05-10 (×2): 3 mL via RESPIRATORY_TRACT
  Filled 2015-05-09 (×2): qty 3

## 2015-05-09 MED ORDER — TIMOLOL MALEATE 0.5 % OP SOLN
1.0000 [drp] | Freq: Every evening | OPHTHALMIC | Status: DC
  Filled 2015-05-09: qty 5

## 2015-05-09 MED ORDER — VITAMIN C 500 MG PO TABS
500.0000 mg | ORAL_TABLET | Freq: Every evening | ORAL | Status: DC
  Administered 2015-05-10: 500 mg via ORAL
  Filled 2015-05-09 (×2): qty 1

## 2015-05-09 MED ORDER — ACETAMINOPHEN 650 MG RE SUPP: 650.0000 mg | Freq: Four times a day (QID) | RECTAL | Status: DC | PRN

## 2015-05-09 MED ORDER — ASPIRIN EC 81 MG PO TBEC
81.0000 mg | DELAYED_RELEASE_TABLET | Freq: Every day | ORAL | Status: DC
  Administered 2015-05-10 – 2015-05-11 (×2): 81 mg via ORAL
  Filled 2015-05-09 (×2): qty 1

## 2015-05-09 MED ORDER — VITAMIN B-12 1000 MCG PO TABS
500.0000 ug | ORAL_TABLET | Freq: Every evening | ORAL | Status: DC
  Administered 2015-05-10: 500 ug via ORAL
  Filled 2015-05-09 (×3): qty 2
  Filled 2015-05-09: qty 1

## 2015-05-09 MED ORDER — PREDNISONE 20 MG PO TABS
40.0000 mg | ORAL_TABLET | Freq: Every day | ORAL | Status: DC
  Administered 2015-05-10 – 2015-05-11 (×2): 40 mg via ORAL
  Filled 2015-05-09 (×2): qty 2

## 2015-05-09 MED ORDER — FOLIC ACID 0.5 MG HALF TAB
800.0000 ug | ORAL_TABLET | Freq: Every evening | ORAL | Status: DC
  Filled 2015-05-09 (×3): qty 2

## 2015-05-09 MED ORDER — BENAZEPRIL HCL 10 MG PO TABS
40.0000 mg | ORAL_TABLET | Freq: Every evening | ORAL | Status: DC
  Administered 2015-05-09 – 2015-05-10 (×2): 40 mg via ORAL
  Filled 2015-05-09: qty 1
  Filled 2015-05-09 (×2): qty 4
  Filled 2015-05-09 (×2): qty 1

## 2015-05-09 MED ORDER — GUAIFENESIN ER 600 MG PO TB12: 600.0000 mg | ORAL_TABLET | Freq: Two times a day (BID) | ORAL | Status: DC | PRN

## 2015-05-09 MED ORDER — ALBUTEROL SULFATE (2.5 MG/3ML) 0.083% IN NEBU: 2.5000 mg | INHALATION_SOLUTION | RESPIRATORY_TRACT | Status: DC

## 2015-05-09 MED ORDER — ALBUTEROL (5 MG/ML) CONTINUOUS INHALATION SOLN
10.0000 mg/h | INHALATION_SOLUTION | Freq: Once | RESPIRATORY_TRACT | Status: AC
  Administered 2015-05-09: 10 mg/h via RESPIRATORY_TRACT
  Filled 2015-05-09: qty 20

## 2015-05-09 MED ORDER — SODIUM CHLORIDE 0.9 % IV SOLN
INTRAVENOUS | Status: DC
  Administered 2015-05-09 – 2015-05-10 (×3): via INTRAVENOUS

## 2015-05-09 MED ORDER — BENZONATATE 100 MG PO CAPS: 200.0000 mg | ORAL_CAPSULE | Freq: Three times a day (TID) | ORAL | Status: DC | PRN

## 2015-05-09 MED ORDER — LATANOPROST 0.005 % OP SOLN
1.0000 [drp] | Freq: Every day | OPHTHALMIC | Status: DC
  Administered 2015-05-10: 1 [drp] via OPHTHALMIC
  Filled 2015-05-09: qty 2.5

## 2015-05-09 MED ORDER — ONDANSETRON HCL 4 MG PO TABS: 4.0000 mg | ORAL_TABLET | Freq: Four times a day (QID) | ORAL | Status: DC | PRN

## 2015-05-09 MED ORDER — POTASSIUM CHLORIDE CRYS ER 10 MEQ PO TBCR
10.0000 meq | EXTENDED_RELEASE_TABLET | Freq: Every evening | ORAL | Status: DC
  Administered 2015-05-10: 10 meq via ORAL
  Filled 2015-05-09 (×2): qty 1

## 2015-05-09 MED ORDER — IPRATROPIUM BROMIDE 0.02 % IN SOLN
1.0000 mg | Freq: Once | RESPIRATORY_TRACT | Status: AC
  Administered 2015-05-09: 1 mg via RESPIRATORY_TRACT
  Filled 2015-05-09: qty 5

## 2015-05-09 MED ORDER — ALBUTEROL SULFATE (2.5 MG/3ML) 0.083% IN NEBU
5.0000 mg | INHALATION_SOLUTION | Freq: Once | RESPIRATORY_TRACT | Status: AC
  Administered 2015-05-09: 5 mg via RESPIRATORY_TRACT
  Filled 2015-05-09: qty 6

## 2015-05-09 MED ORDER — ONDANSETRON HCL 4 MG/2ML IJ SOLN: 4.0000 mg | Freq: Four times a day (QID) | INTRAMUSCULAR | Status: DC | PRN

## 2015-05-09 MED ORDER — DORZOLAMIDE HCL 2 % OP SOLN
1.0000 [drp] | Freq: Two times a day (BID) | OPHTHALMIC | Status: DC
  Administered 2015-05-10 – 2015-05-11 (×2): 1 [drp] via OPHTHALMIC
  Filled 2015-05-09: qty 10

## 2015-05-09 NOTE — ED Notes (Signed)
CRITICAL VALUE ALERT  Critical value received:  Lactic Acid 5.6  Date of notification:  [id number]  Time of notification:  2238  Critical value read back:Yes.    Nurse who received alert:  Clarene Reamerasey Najeeb Uptain RN  MD notified (1st page):  Dr. Katrinka BlazingSmith  Time of first page:  2240

## 2015-05-09 NOTE — ED Provider Notes (Signed)
CSN: [id number]     Arrival date & time 05/09/15  1656 History   First MD Initiated Contact with Patient 05/09/15 1705     Chief Complaint  Patient presents with  . Shortness of Breath  . Cough     HPI Pt was seen at 1715.  Per pt's family and pt, c/o gradual onset and worsening of persistent cough, wheezing and SOB for the past 2 weeks. Pt was evaluated by her PMD 5 days ago; rx antibiotic and cough medication without relief.  Denies CP/palpitations, no back pain, no abd pain, no N/V/D, no fevers, no rash.    Past Medical History  Diagnosis Date  . Hypertension   . Glaucoma   . Kidney stones    Past Surgical History  Procedure Laterality Date  . Thyroid surgery    . Kidney stones    . Hemorroidectomy    . Rotator cuff repair    . Cyst excision    . Cholecystectomy    . Lung biopsy Right   . Cataract extraction    . Partial hysterectomy    . Biopsy thyroid    . Vein surgery    . Abdominal hysterectomy     Family History  Problem Relation Age of Onset  . Anuerysm Father    Social History  Substance Use Topics  . Smoking status: Never Smoker   . Smokeless tobacco: Never Used  . Alcohol Use: No   OB History    Gravida Para Term Preterm AB TAB SAB Ectopic Multiple Living   Review of Systems ROS: Statement: All systems negative except as marked or noted in the HPI; Constitutional: Negative for fever and chills. ; ; Eyes: Negative for eye pain, redness and discharge. ; ; ENMT: Negative for ear pain, hoarseness, nasal congestion, sinus pressure and sore throat. ; ; Cardiovascular: Negative for chest pain, palpitations, diaphoresis, and peripheral edema. ; ; Respiratory: +cough, wheezing, SOB. Negative for stridor. ; ; Gastrointestinal: Negative for nausea, vomiting, diarrhea, abdominal pain, blood in stool, hematemesis, jaundice and rectal bleeding. . ; ; Genitourinary: Negative for dysuria, flank pain and hematuria. ; ; Musculoskeletal: Negative for back  pain and neck pain. Negative for swelling and trauma.; ; Skin: Negative for pruritus, rash, abrasions, blisters, bruising and skin lesion.; ; Neuro: Negative for headache, lightheadedness and neck stiffness. Negative for weakness, altered level of consciousness , altered mental status, extremity weakness, paresthesias, involuntary movement, seizure and syncope.      Allergies  Ibuprofen; Iohexol; Levaquin; Vancomycin; and Penicillins  Home Medications   Prior to Admission medications   Medication Sig Start Date End Date Taking? Authorizing Provider  amLODipine (NORVASC) 10 MG tablet Take 10 mg by mouth every evening.  12/08/13  Yes Historical Provider, MD  aspirin EC 81 MG tablet Take 81 mg by mouth daily.   Yes Historical Provider, MD  benazepril (LOTENSIN) 40 MG tablet Take 40 mg by mouth every evening.  12/08/13  Yes Historical Provider, MD  Calcium-Magnesium-Vitamin D (CALCIUM 1200+D3 PO) Take 1 tablet by mouth every evening.   Yes Historical Provider, MD  dorzolamide (TRUSOPT) 2 % ophthalmic solution Place 1 drop into both eyes 2 (two) times daily. 05/02/15  Yes Historical Provider, MD  Flaxseed, Linseed, (FLAX SEED OIL PO) Take 1,000 mg by mouth 2 (two) times daily.    Yes Historical Provider, MD  folic acid (FOLVITE) 800 MCG tablet Take 800 mcg  by mouth every evening.    Yes Historical Provider, MD  Garlic Oil (ODORLESS GARLIC) 1000 MG CAPS Take 1,000 mg by mouth 2 (two) times daily.   Yes Historical Provider, MD  guaiFENesin (MUCINEX) 600 MG 12 hr tablet Take 600 mg by mouth 2 (two) times daily as needed for cough or to loosen phlegm.   Yes Historical Provider, MD  hydrochlorothiazide (HYDRODIURIL) 25 MG tablet Take 12.5 mg by mouth every evening.  12/27/13  Yes Historical Provider, MD  KLOR-CON M20 20 MEQ tablet Take 10 mEq by mouth every evening. as directed 11/29/13  Yes Historical Provider, MD  latanoprost (XALATAN) 0.005 % ophthalmic solution Place 1 drop into both eyes at bedtime.  04/28/15  Yes Historical Provider, MD  Potassium Chloride ER 20 MEQ TBCR Take 10 mEq by mouth daily. 03/31/15  Yes Historical Provider, MD  timolol (TIMOPTIC) 0.5 % ophthalmic solution Place 1 drop into both eyes every evening. 12/17/13  Yes Historical Provider, MD  vitamin B-12 (CYANOCOBALAMIN) 500 MCG tablet Take 500 mcg by mouth every evening.    Yes Historical Provider, MD  vitamin C (ASCORBIC ACID) 500 MG tablet Take 500 mg by mouth every evening.    Yes Historical Provider, MD  azithromycin (ZITHROMAX) 250 MG tablet Take 250-500 mg by mouth See admin instructions. Reported on 05/09/2015 05/04/15   Historical Provider, MD   BP 122/62 mmHg  Pulse 73  Temp(Src) 97.7 F (36.5 C) (Oral)  Resp 17  Ht 5\' 3"  (1.6 m)  Wt 150 lb (68.04 kg)  BMI 26.58 kg/m2  SpO2 93% Physical Exam  1720: Physical examination:  Nursing notes reviewed; Vital signs and O2 SAT reviewed;  Constitutional: Well developed, Well nourished, Well hydrated, Uncomfortable appearing.; Head:  Normocephalic, atraumatic; Eyes: EOMI, PERRL, No scleral icterus; ENMT: Mouth and pharynx normal, Mucous membranes moist; Neck: Supple, Full range of motion, No lymphadenopathy; Cardiovascular: Regular rate and rhythm, No gallop; Respiratory: Breath sounds dimnished & equal bilaterally, insp/exp wheezes bilat with occasional audible wheezing.  Speaking short sentences, Normal respiratory effort/excursion; Chest: Nontender, Movement normal; Abdomen: Soft, Nontender, Nondistended, Normal bowel sounds; Genitourinary: No CVA tenderness; Extremities: Pulses normal, No tenderness, No edema, No calf edema or asymmetry.; Neuro: AA&Ox3, +HOH, otherwise major CN grossly intact.  Speech clear. No gross focal motor or sensory deficits in extremities.; Skin: Color normal, Warm, Dry.   ED Course  Procedures (including critical care time) Labs Review  Imaging Review  I have personally reviewed and evaluated these images and lab results as part of my medical  decision-making.   EKG Interpretation   Date/Time:  Sunday May 09 2015 17:07:11 EDT Ventricular Rate:  75 PR Interval:  168 QRS Duration: 93 QT Interval:  386 QTC Calculation: 431 R Axis:   -37 Text Interpretation:  Sinus rhythm Multiple ventricular premature  complexes Left axis deviation Low voltage, precordial leads Baseline  wander No old tracing to compare Confirmed by [facility]  MD, Nicholos Johns  [phone]) on 05/09/2015 6:09:25 PM      MDM  MDM Reviewed: previous chart, nursing note and vitals Reviewed previous: labs and ECG Interpretation: labs, ECG and x-ray Total time providing critical care: 30-74 minutes. This excludes time spent performing separately reportable procedures and services. Consults: admitting MD   CRITICAL CARE Performed by: Laray Anger Total critical care time: 35 minutes Critical care time was exclusive of separately billable procedures and treating other patients. Critical care was necessary to treat or prevent imminent or life-threatening deterioration. Critical care was time spent  personally by me on the following activities: development of treatment plan with patient and/or surrogate as well as nursing, discussions with consultants, evaluation of patient's response to treatment, examination of patient, obtaining history from patient or surrogate, ordering and performing treatments and interventions, ordering and review of laboratory studies, ordering and review of radiographic studies, pulse oximetry and re-evaluation of patient's condition.    Results for orders placed or performed during the hospital encounter of 05/09/15  CBC with Differential  Result Value Ref Range   WBC 10.6 (H) 4.0 - 10.5 K/uL   RBC 4.99 3.87 - 5.11 MIL/uL   Hemoglobin 13.7 12.0 - 15.0 g/dL   HCT 78.240.6 95.636.0 - 21.346.0 %   MCV 81.4 78.0 - 100.0 fL   MCH 27.5 26.0 - 34.0 pg   MCHC 33.7 30.0 - 36.0 g/dL   RDW 08.613.6 57.811.5 - 46.915.5 %   Platelets 286 150 - 400 K/uL   Neutrophils  Relative % 62 %   Neutro Abs 6.5 1.7 - 7.7 K/uL   Lymphocytes Relative 24 %   Lymphs Abs 2.6 0.7 - 4.0 K/uL   Monocytes Relative 8 %   Monocytes Absolute 0.9 0.1 - 1.0 K/uL   Eosinophils Relative 6 %   Eosinophils Absolute 0.6 0.0 - 0.7 K/uL   Basophils Relative 0 %   Basophils Absolute 0.0 0.0 - 0.1 K/uL  Comprehensive metabolic panel  Result Value Ref Range   Sodium 142 135 - 145 mmol/L   Potassium 3.6 3.5 - 5.1 mmol/L   Chloride 106 101 - 111 mmol/L   CO2 26 22 - 32 mmol/L   Glucose, Bld 123 (H) 65 - 99 mg/dL   BUN 24 (H) 6 - 20 mg/dL   Creatinine, Ser 6.291.29 (H) 0.44 - 1.00 mg/dL   Calcium 52.810.6 (H) 8.9 - 10.3 mg/dL   Total Protein 8.0 6.5 - 8.1 g/dL   Albumin 4.6 3.5 - 5.0 g/dL   AST 26 15 - 41 U/L   ALT 21 14 - 54 U/L   Alkaline Phosphatase 80 38 - 126 U/L   Total Bilirubin 0.6 0.3 - 1.2 mg/dL   GFR calc non Af Amer 38 (L) >60 mL/min   GFR calc Af Amer 44 (L) >60 mL/min   Anion gap 10 5 - 15  Troponin I  Result Value Ref Range   Troponin I <0.03 <0.031 ng/mL  Lactic acid, plasma  Result Value Ref Range   Lactic Acid, Venous 1.8 0.5 - 2.0 mmol/L   Dg Chest 2 View 05/09/2015  CLINICAL DATA:  Cough, shortness of breath and wheezing today. Initial encounter. EXAM: CHEST  2 VIEW COMPARISON:  CT chest 04/12/2010.  PA and lateral chest 01/29/2014. FINDINGS: The lungs are clear. Heart size is mildly enlarged. No pneumothorax or pleural effusion. Thoracic spondylosis noted. IMPRESSION: No acute disease. Electronically Signed   By: Drusilla Kannerhomas  Dalessio M.D.   On: 05/09/2015 18:01    2125:  On arrival: pt sitting upright, tachypneic, Sats 94 % R/A, lungs diminished with insp/exp wheezes bilat. Pt denies hx of same. Short neb given.  After neb: pt's lungs CTA bilat, pt was ambulated with Sats dropping to 89% R/A and lungs began again to have insp/exp wheezing and audible wheezing. IV solumedrol and hour long neb given. After long neb: pt appears more comfortable at rest, less tachypneic,  Sats 94-96 % on R/A, lungs continue diminished. Pt ambulated in hallway: pt's O2 Sats dropped to 91 % R/A with pt c/o increasing  SOB, tachypnea, and audible wheezing. Pt escorted back to stretcher with O2 Sats slowly increasing to 94-96 % R/A. Dx and testing d/w pt and family.  Questions answered.  Verb understanding, agreeable to observation admit. T/C to [facility] Dr. Katrinka Blazing, case discussed, including:  HPI, pertinent PM/SHx, VS/PE, dx testing, ED course and treatment:  Agreeable to admit, requests to write temporary orders, obtain medical bed to team APAdmits.   Samuel Jester, DO 05/12/15 1538

## 2015-05-09 NOTE — ED Notes (Signed)
Pt c/o SOB, productive cough, and wheezing. States she saw PCP earlier this week and prescribed a z pack. Pt has audible wheezes. Denies hx of COPD/asthma.

## 2015-05-09 NOTE — H&P (Signed)
History and Physical    April Bailey ZOX:[id number] DOB: 1933-01-12 DOA: 05/09/2015  Referring MD/NP/PA:Dr. Clarene Duke PCP: Eldridge Abrahams, MD  Outpatient Specialists: N/a  Patient coming from: Home where she lives alone  Chief Complaint: Cough and shortness of breath  HPI: April Bailey is a 82 y.o. female with medical history significant of hypertension, glaucoma, and seasonal allergies; who presents with complaints of cough and shortness of breath. Symptoms have been progressively worsening over the last 2 weeks. She reports having a minimally productive cough. Previously, evaluated by her PCP earlier in the week who prescribed her a Z-Pak and cough syrup with codeine. She took the Z-Pak as prescribed and completed the course yesterday, but had no improvement in symptoms. Unable to use the cough medicine because it had codeine in it and she was fearful as she lives alone. She also has been utilizing Mucinex. In the last 2 days or so symptoms changed and that she began to wheeze which was new for her. Complains of associated symptoms of fatigue and seasonal allergies, but denies having symptoms this bad in the past. Denies any chest pain, fever, chills, nausea, vomiting, diarrhea, constipation, dysuria, or urinary frequency symptoms. Patient notes that she has never smoked tobacco, drank alcohol, or done any illicit drugs.  ED Course: Upon admission to the emergency department patient was seen to be afebrile with respiratory rate up to 32, oxygenation seemed to be as low as 89% with ambulation on room air, and all other vitals within normal limits. Lab work revealed a WBC 10.6, lactic acid 1.8, BUN 24 creatinine 1.29, calcium 10.6. Given a hour-long nebulization treatment in the emergency department along with 125 mg of Solu-Medrol.   Review of Systems: As per HPI otherwise 10 point review of systems negative.    Past Medical History  Diagnosis Date  . Hypertension   . Glaucoma   .  Kidney stones     Past Surgical History  Procedure Laterality Date  . Thyroid surgery    . Kidney stones    . Hemorroidectomy    . Rotator cuff repair    . Cyst excision    . Cholecystectomy    . Lung biopsy Right   . Cataract extraction    . Partial hysterectomy    . Biopsy thyroid    . Vein surgery    . Abdominal hysterectomy       reports that she has never smoked. She has never used smokeless tobacco. She reports that she does not drink alcohol or use illicit drugs.  Allergies  Allergen Reactions  . Ibuprofen Other (See Comments)    Stomach irritation   . Iohexol   . Levaquin [Levofloxacin In D5w] Other (See Comments)    Legs hurt   . Vancomycin Other (See Comments)    "red man face"  . Penicillins Rash    Has patient had a PCN reaction causing immediate rash, facial/tongue/throat swelling, SOB or lightheadedness with hypotension: Yes Has patient had a PCN reaction causing severe rash involving mucus membranes or skin necrosis: No Has patient had a PCN reaction that required hospitalization No Has patient had a PCN reaction occurring within the last 10 years: Yes If all of the above answers are "NO", then may proceed with Cephalosporin use.     Family History  Problem Relation Age of Onset  . Anuerysm Father     Prior to Admission medications   Medication Sig Start Date End Date Taking? Authorizing Provider  amLODipine (NORVASC) 10  MG tablet Take 10 mg by mouth every evening.  12/08/13  Yes Historical Provider, MD  aspirin EC 81 MG tablet Take 81 mg by mouth daily.   Yes Historical Provider, MD  benazepril (LOTENSIN) 40 MG tablet Take 40 mg by mouth every evening.  12/08/13  Yes Historical Provider, MD  Calcium-Magnesium-Vitamin D (CALCIUM 1200+D3 PO) Take 1 tablet by mouth every evening.   Yes Historical Provider, MD  dorzolamide (TRUSOPT) 2 % ophthalmic solution Place 1 drop into both eyes 2 (two) times daily. 05/02/15  Yes Historical Provider, MD  Flaxseed,  Linseed, (FLAX SEED OIL PO) Take 1,000 mg by mouth 2 (two) times daily.    Yes Historical Provider, MD  folic acid (FOLVITE) 800 MCG tablet Take 800 mcg by mouth every evening.    Yes Historical Provider, MD  Garlic Oil (ODORLESS GARLIC) 1000 MG CAPS Take 1,000 mg by mouth 2 (two) times daily.   Yes Historical Provider, MD  guaiFENesin (MUCINEX) 600 MG 12 hr tablet Take 600 mg by mouth 2 (two) times daily as needed for cough or to loosen phlegm.   Yes Historical Provider, MD  hydrochlorothiazide (HYDRODIURIL) 25 MG tablet Take 12.5 mg by mouth every evening.  12/27/13  Yes Historical Provider, MD  KLOR-CON M20 20 MEQ tablet Take 10 mEq by mouth every evening. as directed 11/29/13  Yes Historical Provider, MD  latanoprost (XALATAN) 0.005 % ophthalmic solution Place 1 drop into both eyes at bedtime. 04/28/15  Yes Historical Provider, MD  Potassium Chloride ER 20 MEQ TBCR Take 10 mEq by mouth daily. 03/31/15  Yes Historical Provider, MD  timolol (TIMOPTIC) 0.5 % ophthalmic solution Place 1 drop into both eyes every evening. 12/17/13  Yes Historical Provider, MD  vitamin B-12 (CYANOCOBALAMIN) 500 MCG tablet Take 500 mcg by mouth every evening.    Yes Historical Provider, MD  vitamin C (ASCORBIC ACID) 500 MG tablet Take 500 mg by mouth every evening.    Yes Historical Provider, MD  azithromycin (ZITHROMAX) 250 MG tablet Take 250-500 mg by mouth See admin instructions. Reported on 05/09/2015 05/04/15   Historical Provider, MD    Physical Exam: Filed Vitals:   05/09/15 2111 05/09/15 2130 05/09/15 2131 05/09/15 2200  BP:  124/54 124/54 112/48  Pulse:  84 85 76  Temp:      TempSrc:      Resp:  Height:      Weight:      SpO2: 96% 91% 92% 90%      Constitutional: NAD, calm, comfortable Filed Vitals:   05/09/15 2111 05/09/15 2130 05/09/15 2131 05/09/15 2200  BP:  124/54 124/54 112/48  Pulse:  84 85 76  Temp:      TempSrc:      Resp:  Height:      Weight:      SpO2: 96% 91%  92% 90%   Eyes: PERRL, lids and conjunctivae normal ENMT: Mucous membranes are moist. Posterior pharynx clear of any exudate or lesions.Normal dentition.  Neck: normal, supple, no masses, no thyromegaly Respiratory: Normal respiratory effort with bilateral expiratory wheezes. No rhonchi appreciated Cardiovascular: Regular rate and rhythm, no murmurs / rubs / gallops. No extremity edema. 2+ pedal pulses. No carotid bruits.  Abdomen: no tenderness, no masses palpated. No hepatosplenomegaly. Bowel sounds positive.  Musculoskeletal: no clubbing / cyanosis. No joint deformity upper and lower extremities. Good ROM, no contractures. Normal muscle tone.  Skin: no rashes, lesions, ulcers. No induration Neurologic:  CN 2-12 grossly intact. Sensation intact, DTR normal. Strength 5/5 in all 4.  Psychiatric: Normal judgment and insight. Alert and oriented x 3. Normal mood.    Labs on Admission: I have personally reviewed following labs and imaging studies  CBC:  Recent Labs Lab 05/09/15 1650  WBC 10.6*  NEUTROABS 6.5  HGB 13.7  HCT 40.6  MCV 81.4  PLT 286   Basic Metabolic Panel:  Recent Labs Lab 05/09/15 1650  NA 142  K 3.6  CL 106  CO2 26  GLUCOSE 123*  BUN 24*  CREATININE 1.29*  CALCIUM 10.6*   GFR: Estimated Creatinine Clearance: 32.2 mL/min (by C-G formula based on Cr of 1.29). Liver Function Tests:  Recent Labs Lab 05/09/15 1650  AST 26  ALT 21  ALKPHOS 80  BILITOT 0.6  PROT 8.0  ALBUMIN 4.6   No results for input(s): LIPASE, AMYLASE in the last 168 hours. No results for input(s): AMMONIA in the last 168 hours. Coagulation Profile: No results for input(s): INR, PROTIME in the last 168 hours. Cardiac Enzymes:  Recent Labs Lab 05/09/15 1731  TROPONINI <0.03   BNP (last 3 results) No results for input(s): PROBNP in the last 8760 hours. HbA1C: No results for input(s): HGBA1C in the last 72 hours. CBG: No results for input(s): GLUCAP in the last 168  hours. Lipid Profile: No results for input(s): CHOL, HDL, LDLCALC, TRIG, CHOLHDL, LDLDIRECT in the last 72 hours. Thyroid Function Tests: No results for input(s): TSH, T4TOTAL, FREET4, T3FREE, THYROIDAB in the last 72 hours. Anemia Panel: No results for input(s): VITAMINB12, FOLATE, FERRITIN, TIBC, IRON, RETICCTPCT in the last 72 hours. Urine analysis:    Component Value Date/Time   COLORURINE YELLOW 01/29/2014 1030   APPEARANCEUR CLEAR 01/29/2014 1030   LABSPEC 1.010 01/29/2014 1030   PHURINE 6.5 01/29/2014 1030   GLUCOSEU NEGATIVE 01/29/2014 1030   HGBUR NEGATIVE 01/29/2014 1030   BILIRUBINUR NEGATIVE 01/29/2014 1030   KETONESUR NEGATIVE 01/29/2014 1030   PROTEINUR TRACE* 01/29/2014 1030   UROBILINOGEN 0.2 01/29/2014 1030   NITRITE NEGATIVE 01/29/2014 1030   LEUKOCYTESUR NEGATIVE 01/29/2014 1030   Sepsis Labs:No results found for this or any previous visit (from the past 240 hour(s)).   Radiological Exams on Admission: Dg Chest 2 View  05/09/2015  CLINICAL DATA:  Cough, shortness of breath and wheezing today. Initial encounter. EXAM: CHEST  2 VIEW COMPARISON:  CT chest 04/12/2010.  PA and lateral chest 01/29/2014. FINDINGS: The lungs are clear. Heart size is mildly enlarged. No pneumothorax or pleural effusion. Thoracic spondylosis noted. IMPRESSION: No acute disease. Electronically Signed   By: Drusilla Kannerhomas  Dalessio M.D.   On: 05/09/2015 18:01    EKG: Independently reviewed. Sinus rhythm  Assessment/Plan Acute Bronchitis with hypoxia: Patient reports an almost 2 week history of cough, shortness of breath, and now wheezing. Patient given IV Solu-Medrol 125 mg and continue his DuoNeb while in the ED. Eosinophil count at upper limit of normal. Suspect acute bronchitis question possibility of underlying pneumonia. - Admit to MedSurg bed   - DuoNeb's QID - Prednisone 40 mg by mouth  - Continue Mucinex - Tessalon Perles prn cough - May consider repeat chest x-ray in a.m. - Given the  clinical picture does not appear to require antibiotics currently.  Leukocytosis: WBC 10.6 on admission - Follow-up CBC in a.m.  Questionable lactic acidosis: Patient initial lactic acid was 1.8, but repeat noted to be 5. - Repeat lactic acid level stat  Chronic kidney disease stage III: Patient's  baseline creatinine appears to be somewhere around 1.05. Patient's creatinine elevated at 1.29 with elevation in the BUN to 24 which may suggest a prerenal state. - IV fluids normal saline at 75 ml/hr overnight - Repeat BMP in a.m.  Essential hypertension - Continue amlodipine, Lotensin  Hypercalcemia: Calcium level noted to be 10.6 on admission just mildly elevated - held calcium supplement - recheck BMP in am  DVT prophylaxis: Lovenox Code Status: Full Family Communication: Discussed plan with daughter who is present at bedside Disposition Plan: Possible discharge tomorrow Consults called: None Admission status: MedSurg obs   Clydie Braun MD [facility] Pager [phone]  If 7PM-7AM, please contact night-coverage [email or web address] Password [email or web address]  05/09/2015, 10:30 PM

## 2015-05-09 NOTE — ED Notes (Signed)
Pt ambulate with standby assistance on room air with pulse ox pt maintained 91-96%.

## 2015-05-09 NOTE — ED Notes (Signed)
Pt ambulated around nurses station.  Sats dropped to 89-92% with labored respirations and audible wheezing.  Pt states she feels better upon returning to bed and sats increased to 95%

## 2015-05-09 NOTE — ED Notes (Signed)
Wheezing diminished after nebulizer treatment.  Pt states she feels the same.  Respirations even and unlabored.  Minimal expiratory wheezing noted at this time, much less than pre-nebulizer.

## 2015-05-09 NOTE — ED Notes (Signed)
Verbal order to repeat lactic acid

## 2015-05-10 ENCOUNTER — Observation Stay (HOSPITAL_COMMUNITY): Payer: Medicare Other

## 2015-05-10 DIAGNOSIS — R05 Cough: Secondary | ICD-10-CM | POA: Diagnosis present

## 2015-05-10 DIAGNOSIS — N179 Acute kidney failure, unspecified: Secondary | ICD-10-CM | POA: Diagnosis present

## 2015-05-10 DIAGNOSIS — J40 Bronchitis, not specified as acute or chronic: Secondary | ICD-10-CM | POA: Diagnosis not present

## 2015-05-10 DIAGNOSIS — I10 Essential (primary) hypertension: Secondary | ICD-10-CM

## 2015-05-10 DIAGNOSIS — N183 Chronic kidney disease, stage 3 (moderate): Secondary | ICD-10-CM | POA: Diagnosis not present

## 2015-05-10 DIAGNOSIS — E860 Dehydration: Secondary | ICD-10-CM | POA: Diagnosis present

## 2015-05-10 DIAGNOSIS — H409 Unspecified glaucoma: Secondary | ICD-10-CM | POA: Diagnosis present

## 2015-05-10 DIAGNOSIS — D72829 Elevated white blood cell count, unspecified: Secondary | ICD-10-CM | POA: Diagnosis present

## 2015-05-10 DIAGNOSIS — I129 Hypertensive chronic kidney disease with stage 1 through stage 4 chronic kidney disease, or unspecified chronic kidney disease: Secondary | ICD-10-CM | POA: Diagnosis present

## 2015-05-10 DIAGNOSIS — Z7982 Long term (current) use of aspirin: Secondary | ICD-10-CM | POA: Diagnosis not present

## 2015-05-10 DIAGNOSIS — J209 Acute bronchitis, unspecified: Secondary | ICD-10-CM | POA: Diagnosis present

## 2015-05-10 LAB — BASIC METABOLIC PANEL
ANION GAP: 16 — AB (ref 5–15)
ANION GAP: 12 (ref 5–15)
BUN: 24 mg/dL — ABNORMAL HIGH (ref 6–20)
BUN: 23 mg/dL — ABNORMAL HIGH (ref 6–20)
CALCIUM: 9.3 mg/dL (ref 8.9–10.3)
CHLORIDE: 108 mmol/L (ref 101–111)
CO2: 18 mmol/L — AB (ref 22–32)
CO2: 20 mmol/L — AB (ref 22–32)
CREATININE: 1.25 mg/dL — AB (ref 0.44–1.00)
Calcium: 9.5 mg/dL (ref 8.9–10.3)
Chloride: 104 mmol/L (ref 101–111)
Creatinine, Ser: 1.42 mg/dL — ABNORMAL HIGH (ref 0.44–1.00)
GFR calc Af Amer: 46 mL/min — ABNORMAL LOW (ref >60)
GFR calc non Af Amer: 34 mL/min — ABNORMAL LOW (ref >60)
GFR calc non Af Amer: 40 mL/min — ABNORMAL LOW (ref >60)
GFR, EST AFRICAN AMERICAN: 39 mL/min — AB (ref >60)
GLUCOSE: 345 mg/dL — AB (ref 65–99)
GLUCOSE: 297 mg/dL — AB (ref 65–99)
POTASSIUM: 3.3 mmol/L — AB (ref 3.5–5.1)
Potassium: 3.9 mmol/L (ref 3.5–5.1)
Sodium: 138 mmol/L (ref 135–145)
Sodium: 140 mmol/L (ref 135–145)

## 2015-05-10 LAB — CBC
HEMATOCRIT: 33.3 % — AB (ref 36.0–46.0)
HEMOGLOBIN: 11.2 g/dL — AB (ref 12.0–15.0)
MCH: 27.4 pg (ref 26.0–34.0)
MCHC: 33.6 g/dL (ref 30.0–36.0)
MCV: 81.4 fL (ref 78.0–100.0)
Platelets: 247 10*3/uL (ref 150–400)
RBC: 4.09 MIL/uL (ref 3.87–5.11)
RDW: 13.8 % (ref 11.5–15.5)
WBC: 8.2 10*3/uL (ref 4.0–10.5)

## 2015-05-10 LAB — LACTIC ACID, PLASMA
LACTIC ACID, VENOUS: 4.5 mmol/L — AB (ref 0.5–2.0)
Lactic Acid, Venous: 6.9 mmol/L (ref 0.5–2.0)
Lactic Acid, Venous: 4.8 mmol/L (ref 0.5–2.0)

## 2015-05-10 MED ORDER — SODIUM CHLORIDE 0.45 % IV BOLUS
500.0000 mL | Freq: Once | INTRAVENOUS | Status: AC
  Administered 2015-05-10: 500 mL via INTRAVENOUS

## 2015-05-10 MED ORDER — DEXTROSE 5 % IV SOLN
1.0000 g | Freq: Three times a day (TID) | INTRAVENOUS | Status: DC
  Administered 2015-05-10 – 2015-05-11 (×3): 1 g via INTRAVENOUS
  Filled 2015-05-10 (×7): qty 1

## 2015-05-10 MED ORDER — SODIUM CHLORIDE 0.9 % IV BOLUS (SEPSIS)
1000.0000 mL | Freq: Once | INTRAVENOUS | Status: AC
  Administered 2015-05-10: 1000 mL via INTRAVENOUS

## 2015-05-10 MED ORDER — DEXTROSE 5 % IV SOLN
INTRAVENOUS | Status: AC
  Filled 2015-05-10: qty 2

## 2015-05-10 MED ORDER — GUAIFENESIN ER 600 MG PO TB12: 1200.0000 mg | ORAL_TABLET | Freq: Two times a day (BID) | ORAL | Status: DC | PRN

## 2015-05-10 MED ORDER — IPRATROPIUM-ALBUTEROL 0.5-2.5 (3) MG/3ML IN SOLN
3.0000 mL | Freq: Four times a day (QID) | RESPIRATORY_TRACT | Status: DC
  Administered 2015-05-10 – 2015-05-11 (×3): 3 mL via RESPIRATORY_TRACT
  Filled 2015-05-10 (×3): qty 3

## 2015-05-10 MED ORDER — ENOXAPARIN SODIUM 30 MG/0.3ML ~~LOC~~ SOLN
30.0000 mg | SUBCUTANEOUS | Status: DC
  Administered 2015-05-10: 30 mg via SUBCUTANEOUS
  Filled 2015-05-10: qty 0.3

## 2015-05-10 MED ORDER — FOLIC ACID 1 MG PO TABS
1.0000 mg | ORAL_TABLET | Freq: Every day | ORAL | Status: DC
  Administered 2015-05-10 – 2015-05-11 (×2): 1 mg via ORAL
  Filled 2015-05-10 (×2): qty 1

## 2015-05-10 MED ORDER — DEXTROSE 5 % IV SOLN
2.0000 g | Freq: Once | INTRAVENOUS | Status: AC
  Administered 2015-05-10: 2 g via INTRAVENOUS
  Filled 2015-05-10: qty 2

## 2015-05-10 NOTE — Progress Notes (Signed)
CRITICAL VALUE ALERT  Critical value received:  Lactic acid 4.5  Date of notification:  05/10/15  Time of notification:  0015  Critical value read back:Yes.    Nurse who received alert:  Richardean ChimeraMarion Jonatha Gagen  MD notified (1st page):  L. Harduk  Time of first page:  0019  MD notified (2nd page):  Time of second page:  Responding MD:  Wyn ForsterL. Harduk  Time MD responded:  0030, entered orders

## 2015-05-10 NOTE — Progress Notes (Signed)
Pharmacy Antibiotic Note  April Bailey is a 82 y.o. female admitted on 05/09/2015 with sepsis.  Pharmacy has been consulted for AZTREONAM dosing.  Plan: Aztreonam 1gm IV q8hrs Monitor labs, renal fxn, progress and c/s  Height: 5\' 3"  (160 cm) Weight: 147 lb 11.3 oz (67 kg) IBW/kg (Calculated) : 52.4  Temp (24hrs), Avg:97.9 F (36.6 C), Min:97.7 F (36.5 C), Max:98.2 F (36.8 C)   Recent Labs Lab 05/09/15 1650 05/09/15 1731 05/09/15 2109 05/09/15 2247 05/10/15 0130 05/10/15 0140  WBC 10.6*  --   --   --  8.2  --   CREATININE 1.29*  --   --   --  1.42*  --   LATICACIDVEN  --  1.8 5.6* 4.5*  --  6.9*    Estimated Creatinine Clearance: 29 mL/min (by C-G formula based on Cr of 1.42).    Allergies  Allergen Reactions  . Ibuprofen Other (See Comments)    Stomach irritation   . Iohexol   . Levaquin [Levofloxacin In D5w] Other (See Comments)    Legs hurt   . Vancomycin Other (See Comments)    "red man face"  . Penicillins Rash    Has patient had a PCN reaction causing immediate rash, facial/tongue/throat swelling, SOB or lightheadedness with hypotension: Yes Has patient had a PCN reaction causing severe rash involving mucus membranes or skin necrosis: No Has patient had a PCN reaction that required hospitalization No Has patient had a PCN reaction occurring within the last 10 years: Yes If all of the above answers are "NO", then may proceed with Cephalosporin use.    Anti-infectives    Start     Dose/Rate Route Frequency Ordered Stop   05/10/15 1000  aztreonam (AZACTAM) 1 g in dextrose 5 % 50 mL IVPB     1 g 100 mL/hr over 30 Minutes Intravenous Every 8 hours 05/10/15 0757     05/10/15 0130  aztreonam (AZACTAM) 2 g in dextrose 5 % 50 mL IVPB     2 g 100 mL/hr over 30 Minutes Intravenous  Once 05/10/15 0124 05/10/15 0253     Microbiology results: 4/24 BCx: pending  Thank you for allowing pharmacy to be a part of this patient's care.  Valrie HartHall, Manas Hickling A 05/10/2015  8:31 AM

## 2015-05-10 NOTE — Progress Notes (Signed)
CRITICAL VALUE ALERT  Critical value received:  Lactic acid 6.9  Date of notification:  05/10/15  Time of notification:  0300  Critical value read back:Yes.    Nurse who received alert:  Richardean ChimeraMarion Jermiah Howton  MD notified (1st page):  Dr Onalee Huaavid  Time of first page:  0306  MD notified (2nd page):  Time of second page:  Responding MD: Dr Onalee Huaavid  Time MD responded:  [phone]

## 2015-05-10 NOTE — Progress Notes (Signed)
PROGRESS NOTE    April Bailey  ZOX:[id number] DOB: 1932/10/23 DOA: 05/09/2015 PCP: Eldridge Abrahams, MD  Outpatient Specialists:   Brief Narrative:  38 yof with past medical history of HTN, glaucoma presented with complaints of cough and shortness of breath that have progressively worsening over 2 weeks. Evaluated by PCP and prescribed Z-pack and competed the course with no improvement in symptoms. While in the ED noted to have mild wheezing, elevated lactic acid, sats of 89% while ambulating. She was referred for admission.    Assessment & Plan:   Principal Problem:   Bronchitis Active Problems:   Acute bronchospasm   CKD (chronic kidney disease) stage 3, GFR 30-59 ml/min   Essential hypertension   Hypercalcemia   1. Acute bronchitis, admitted with shortness of breath and wheezing. Pt started on IV abx, mucinex, steroids, and nebs. She feels as though her breathing is improving. Wheezing has resolved. Continue fluids and repeat CXR to rule out developing pneumonia.. BC in process.  2. Elevated lactic acid. Etiology unclear. Clinically she does not appear to be septic. Metformin is currently being held. Continue IV hydration and repeat in the am. She has remained afebrile with no leukocytosis.  3. AKI on CKD Stage III. Creatinine trending up 1.42, today. Baseline creatine noted to be 1.05. Continue IVF and monitor.  4. Essential HTN, Continue amlodipine, Lostensin. 5. Hypercalcemia, resolved.    DVT prophylaxis: Lovenox  Code Status: Full Family Communication: Family bedside.  Disposition Plan: Anticipate discharge home in 24 hours.   Consultants:   None  Procedures:   None  Antimicrobials:  Azactam 4/24>>  Subjective: Feeling better then she did yesterday. Breathing is improving but still has mild cough. She complains of weakness and DOE. She reports recent nasal congestion and drainage over the last few months. She is able to eat without difficulty.    Objective: Filed Vitals:   05/09/15 2200 05/09/15 2319 05/10/15 0001 05/10/15 0632  BP: 112/48 118/47  106/35  Pulse: 76 78  81  Temp:  98.2 F (36.8 C)  97.8 F (36.6 C)  TempSrc:  Oral  Oral  Resp: Height:   (1.6 m)    Weight:  67 kg (147 lb 11.3 oz)    SpO2: 90% 96% 94% 96%   No intake or output data in the 24 hours ending 05/10/15 0731 Filed Weights   05/09/15 1708 05/09/15 2319  Weight: 68.04 kg (150 lb) 67 kg (147 lb 11.3 oz)    Examination: General exam: Appears calm and comfortable. Does not appear toxic.  Respiratory system: Clear to auscultation. Respiratory effort normal. Cardiovascular system: S1 & S2 heard, RRR. No JVD, murmurs, rubs, gallops or clicks. No pedal edema. Gastrointestinal system: Abdomen is nondistended, soft and nontender. No organomegaly or masses felt. Normal bowel sounds heard. Central nervous system: Alert and oriented. No focal neurological deficits. Psychiatry: Judgement and insight appear normal. Mood & affect appropriate.     Data Reviewed: I have personally reviewed following labs and imaging studies  CBC:  Recent Labs Lab 05/09/15 1650 05/10/15 0130  WBC 10.6* 8.2  NEUTROABS 6.5  --   HGB 13.7 11.2*  HCT 40.6 33.3*  MCV 81.4 81.4  PLT 286 247   Basic Metabolic Panel:  Recent Labs Lab 05/09/15 1650 05/10/15 0130  NA 142 138  K 3.6 3.3*  CL 106 104  CO2 26 18*  GLUCOSE 123* 345*  BUN 24* 24*  CREATININE 1.29* 1.42*  CALCIUM 10.6*  9.5   Liver Function Tests:  Recent Labs Lab 05/09/15 1650  AST 26  ALT 21  ALKPHOS 80  BILITOT 0.6  PROT 8.0  ALBUMIN 4.6    Recent Labs Lab 05/09/15 1731  TROPONINI <0.03   Urine analysis:    Component Value Date/Time   COLORURINE YELLOW 01/29/2014 1030   APPEARANCEUR CLEAR 01/29/2014 1030   LABSPEC 1.010 01/29/2014 1030   PHURINE 6.5 01/29/2014 1030   GLUCOSEU NEGATIVE 01/29/2014 1030   HGBUR NEGATIVE 01/29/2014 1030   BILIRUBINUR NEGATIVE  01/29/2014 1030   KETONESUR NEGATIVE 01/29/2014 1030   PROTEINUR TRACE* 01/29/2014 1030   UROBILINOGEN 0.2 01/29/2014 1030   NITRITE NEGATIVE 01/29/2014 1030   LEUKOCYTESUR NEGATIVE 01/29/2014 1030   Sepsis Labs: @LABRCNTIP (procalcitonin:4,lacticidven:4)  ) Recent Results (from the past 240 hour(s))  Culture, blood (routine x 2)     Status: None (Preliminary result)   Collection Time: 05/10/15  1:30 AM  Result Value Ref Range Status   Specimen Description RIGHT ANTECUBITAL  Final   Special Requests BOTTLES DRAWN AEROBIC AND ANAEROBIC 6CC  Final   Culture PENDING  Incomplete   Report Status PENDING  Incomplete  Culture, blood (routine x 2)     Status: None (Preliminary result)   Collection Time: 05/10/15  1:40 AM  Result Value Ref Range Status   Specimen Description BLOOD RIGHT ARM  Final   Special Requests BOTTLES DRAWN AEROBIC AND ANAEROBIC 6CC  Final   Culture PENDING  Incomplete   Report Status PENDING  Incomplete         Radiology Studies: Dg Chest 2 View  05/09/2015  CLINICAL DATA:  Cough, shortness of breath and wheezing today. Initial encounter. EXAM: CHEST  2 VIEW COMPARISON:  CT chest 04/12/2010.  PA and lateral chest 01/29/2014. FINDINGS: The lungs are clear. Heart size is mildly enlarged. No pneumothorax or pleural effusion. Thoracic spondylosis noted. IMPRESSION: No acute disease. Electronically Signed   By: Drusilla Kannerhomas  Dalessio M.D.   On: 05/09/2015 18:01        Scheduled Meds: . amLODipine  10 mg Oral QPM  . aspirin EC  81 mg Oral Daily  . benazepril  40 mg Oral QPM  . dorzolamide  1 drop Both Eyes BID  . enoxaparin (LOVENOX) injection  40 mg Subcutaneous Q24H  . folic acid  800 mcg Oral QPM  . ipratropium-albuterol  3 mL Nebulization QID  . latanoprost  1 drop Both Eyes QHS  . potassium chloride SA  10 mEq Oral QPM  . predniSONE  40 mg Oral Q breakfast  . timolol  1 drop Both Eyes QPM  . vitamin B-12  500 mcg Oral QPM  . vitamin C  500 mg Oral QPM    Continuous Infusions: . sodium chloride 75 mL/hr at 05/10/15 [id number]        Time spent: 25 minutes   Erick BlinksMemon, Ashlay Altieri, MD  [facility] Pager [phone]  If 7PM-7AM, please contact night-coverage [email or web address] Password [email or web address] 05/10/2015, 7:31 AM     By signing my name below, I, Zadie CleverlyJessica Augustus, attest that this documentation has been prepared under the direction and in the presence of Erick BlinksJehanzeb Shardee Dieu, MD. Electronically signed: Zadie CleverlyJessica Augustus, Scribe. 05/10/2015 2:45pm   I, Dr. Erick BlinksJehanzeb Tacori Kvamme, personally performed the services described in this documentaiton. All medical record entries made by the scribe were at my direction and in my presence. I have reviewed the chart and agree that the record reflects my personal performance and is accurate and complete  Bed Bath & BeyondJehanzeb  Kerry Hough, MD, 05/10/2015 3:03 PM

## 2015-05-10 NOTE — Progress Notes (Signed)
[facility] called to follow up on lactic acid for patient and gave additional orders, will follow orders given and continue to monitor the patient.

## 2015-05-10 NOTE — Care Management Obs Status (Signed)
MEDICARE OBSERVATION STATUS NOTIFICATION   Patient Details  Name: April PiggGracie Dimario MRN: [id number] Date of Birth: 07/23/1932   Medicare Observation Status Notification Given:  Yes    Adonis HugueninBerkhead, Jesyca Weisenburger L, RN 05/10/2015, 10:51 AM

## 2015-05-10 NOTE — Progress Notes (Signed)
Inpatient Diabetes Program Recommendations  AACE/ADA: New Consensus Statement on Inpatient Glycemic Control (2015)  Target Ranges:  Prepandial:   less than 140 mg/dL      Peak postprandial:   less than 180 mg/dL (1-2 hours)      Critically ill patients:  140 - 180 mg/dL   Review of Glycemic Control  Diabetes history: No hx DM Outpatient Diabetes medications: N/A Current orders for Inpatient glycemic control: None  Results for Randel PiggFRANCISCO, Hilda (MRN [id number]) as of 05/10/2015 11:48  Ref. Range 05/10/2015 01:30 05/10/2015 09:02  Glucose Latest Ref Range: 65-99 mg/dL 409345 (H) 811297 (H)    Inpatient Diabetes Program Recommendations:    Add Novolog sensitive tidwc while on steroids. HgbA1C to assess glycemic control prior to hospitalization.  Will follow. Thank you. Ailene Ardshonda Dinari Stgermaine, RD, LDN, CDE Inpatient Diabetes Coordinator [phone]

## 2015-05-10 NOTE — Progress Notes (Signed)
CRITICAL VALUE ALERT  Critical value received:  Lactic acid = 4.8  Date of notification:  05/10/15  Time of notification:  1010  Critical value read back:Yes.    Nurse who received alert:  Antony OdeaJason Gabriell Daigneault  MD notified (1st page):  Dr. Kerry HoughMemon  Time of first page:  1027  MD notified (2nd page):  Time of second page:  Responding MD:    Time MD responded:

## 2015-05-10 NOTE — Care Management Note (Signed)
Case Management Note  Patient Details  Name: April PiggGracie Bailey MRN: [id number] Date of Birth: March 09, 1932  Subjective/Objective:    Spoke with patient and daughter.  Patient Alert and oriented and somewhat hard of hearing.      Patient stated that she is from home and independent and does not use a cane or walker. Patient continues to drives self. Denies issues with obtaining medicaitons , Not on O2.  Has PCP and insurance. No CM needs anticipated. Daughter is helpful in her care and is local.          Action/Plan: Home with self care.   Expected Discharge Date:  05/11/15               Expected Discharge Plan:  Home/Self Care  In-House Referral:     Discharge planning Services  CM Consult  Post Acute Care Choice:  NA Choice offered to:  NA  DME Arranged:  N/A DME Agency:  NA  HH Arranged:  NA HH Agency:     Status of Service:  Completed, signed off  Medicare Important Message Given:    Date Medicare IM Given:    Medicare IM give by:    Date Additional Medicare IM Given:    Additional Medicare Important Message give by:     If discussed at Long Length of Stay Meetings, dates discussed:    Additional Comments:  Adonis HugueninBerkhead, [name], RN 05/10/2015, 10:53 AM

## 2015-05-11 LAB — BASIC METABOLIC PANEL
ANION GAP: 10 (ref 5–15)
BUN: 21 mg/dL — AB (ref 6–20)
CALCIUM: 8.8 mg/dL — AB (ref 8.9–10.3)
CO2: 20 mmol/L — ABNORMAL LOW (ref 22–32)
Chloride: 113 mmol/L — ABNORMAL HIGH (ref 101–111)
Creatinine, Ser: 0.95 mg/dL (ref 0.44–1.00)
GFR calc Af Amer: >60 mL/min (ref >60)
GFR, EST NON AFRICAN AMERICAN: 55 mL/min — AB (ref >60)
GLUCOSE: 145 mg/dL — AB (ref 65–99)
Potassium: 3.5 mmol/L (ref 3.5–5.1)
SODIUM: 143 mmol/L (ref 135–145)

## 2015-05-11 LAB — LACTIC ACID, PLASMA: Lactic Acid, Venous: 2.1 mmol/L (ref 0.5–2.0)

## 2015-05-11 MED ORDER — IPRATROPIUM-ALBUTEROL 0.5-2.5 (3) MG/3ML IN SOLN: 3.0000 mL | Freq: Two times a day (BID) | RESPIRATORY_TRACT | Status: DC

## 2015-05-11 MED ORDER — IPRATROPIUM-ALBUTEROL 0.5-2.5 (3) MG/3ML IN SOLN: 3.0000 mL | Freq: Four times a day (QID) | RESPIRATORY_TRACT | Status: DC | PRN

## 2015-05-11 MED ORDER — ALBUTEROL SULFATE HFA 108 (90 BASE) MCG/ACT IN AERS: 2.0000 | INHALATION_SPRAY | Freq: Four times a day (QID) | RESPIRATORY_TRACT | Status: AC | PRN

## 2015-05-11 MED ORDER — PREDNISONE 10 MG PO TABS: ORAL_TABLET | ORAL | Status: DC

## 2015-05-11 NOTE — Discharge Summary (Signed)
Physician Discharge Summary  April Bailey ZOX:[id number] DOB: 08-16-1932 DOA: 05/09/2015  PCP: Eldridge Abrahams, MD  Admit date: 05/09/2015 Discharge date: 05/11/2015  Time spent: 35 minutes  Recommendations for Outpatient Follow-up:  1. Follow up with PCP in 1-2 weeks 2. She will be discharged with a course of steroids and albuterol prn   Discharge Diagnoses:  Principal Problem:   Bronchitis Active Problems:   Acute bronchospasm   CKD (chronic kidney disease) stage 3, GFR 30-59 ml/min   Essential hypertension   Hypercalcemia   Discharge Condition: improved  Diet recommendation: Regular  Filed Weights   05/09/15 1708 05/09/15 2319  Weight: 68.04 kg (150 lb) 67 kg (147 lb 11.3 oz)    History of present illness:  89 yof with a hx of HTN and glaucoma presented with complaints of minimally productive cough and SOB that had been progressively worsening for about 2 weeks. She had previously been evaluated by there PCP earlier in the week who prescribed her Z-pak and cough syrup with codeine. She completed her Z-Pak course the day before admission, but had no improvement in her symptoms. 2 days before admission, she began to wheeze and complained of associated fatigue. She denied any CP, fever, chills, N/V/D. While in the ED, patient was found to be afebrile with a respiratory rate 32 and sats to 89%. All other vitals were wnl. She received solumedrol and was given nebs in the ED. She was referred for admission.  Hospital Course:  While in the ED, pt was found to be tachypneic with O2 sats in the 80's for which she was given steroids and nebs. She was admitted for acute bronchitis and was started on IV abx, mucinex, steroids, and nebs. Blood cultures were sent which showed no growth. Her breathing began to improve and her wheezing resolved with treatment via abx, nebs, and steroids. A repeat CXR was ordered to rule out any developing PNA and showed no active lung disease. She was  evaluated by physical therapy on 4/25 and deemed ready for discharge. She feels that her breathing is back to baseline and is able to ambulate comfortably on RA. It is recommended that she follow up with her PCP in 1-2 weeks.  1. Elevated lactic acid. Etiology unclear although may be related to dehydration. Clinically she does not appear to be septic. Diuretics were held, and she was aggressivley hydrated with IVFs. She has remained afebrile with no leukocytosis. She did not appear toxic and her lactic acid has since trended down. She has been advised to continue hydration at home.  2. AKI on CKD Stage III. Creatinine improved and now wnl. Baseline creatine noted to be 1.05.   3. Essential HTN, Continue amlodipine, and Benazapril. Will hole HCTZ for now. 4. Hypercalcemia. Resolved with hydration  Procedures:  none  Consultations:  none  Discharge Exam: Filed Vitals:   05/10/15 2206 05/11/15 0523  BP: 103/76 109/43  Pulse: 81 58  Temp: 97.6 F (36.4 C) 98.4 F (36.9 C)  Resp: 20 20   Examination:  General exam: Appears calm and comfortable  Respiratory system: Clear to auscultation. Respiratory effort normal. Cardiovascular system: S1 & S2 heard, RRR. No JVD, murmurs, rubs, gallops or clicks. No pedal edema. Gastrointestinal system: Abdomen is nondistended, soft and nontender. No organomegaly or masses felt. Normal bowel sounds heard. Central nervous system: Alert and oriented. No focal neurological deficits. Extremities: Symmetric 5 x 5 power. Skin: No rashes, lesions or ulcers Psychiatry: Judgement and insight appear normal. Mood &  affect appropriate.   Discharge Instructions   Discharge Instructions    Diet - low sodium heart healthy    Complete by:  As directed      Increase activity slowly    Complete by:  As directed           Current Discharge Medication List    START taking these medications   Details  albuterol (PROVENTIL HFA;VENTOLIN HFA) 108 (90 Base)  MCG/ACT inhaler Inhale 2 puffs into the lungs every 6 (six) hours as needed for wheezing or shortness of breath. Qty: 1 Inhaler, Refills: 2    predniSONE (DELTASONE) 10 MG tablet Take 40mg  po daily for 2 days then 30mg  daily for 2 days then 20mg  daily for 2 days then 10mg  daily for 2 days then stop Qty: 20 tablet, Refills: 0      CONTINUE these medications which have NOT CHANGED   Details  amLODipine (NORVASC) 10 MG tablet Take 10 mg by mouth every evening.  Refills: 5    aspirin EC 81 MG tablet Take 81 mg by mouth daily.    benazepril (LOTENSIN) 40 MG tablet Take 40 mg by mouth every evening.  Refills: 5    Calcium-Magnesium-Vitamin D (CALCIUM 1200+D3 PO) Take 1 tablet by mouth every evening.    dorzolamide (TRUSOPT) 2 % ophthalmic solution Place 1 drop into both eyes 2 (two) times daily. Refills: 3    Flaxseed, Linseed, (FLAX SEED OIL PO) Take 1,000 mg by mouth 2 (two) times daily.     folic acid (FOLVITE) 800 MCG tablet Take 800 mcg by mouth every evening.     Garlic Oil (ODORLESS GARLIC) 1000 MG CAPS Take 1,000 mg by mouth 2 (two) times daily.    guaiFENesin (MUCINEX) 600 MG 12 hr tablet Take 600 mg by mouth 2 (two) times daily as needed for cough or to loosen phlegm.    KLOR-CON M20 20 MEQ tablet Take 10 mEq by mouth every evening. as directed Refills: 5    latanoprost (XALATAN) 0.005 % ophthalmic solution Place 1 drop into both eyes at bedtime. Refills: 5    timolol (TIMOPTIC) 0.5 % ophthalmic solution Place 1 drop into both eyes every evening. Refills: 5    vitamin B-12 (CYANOCOBALAMIN) 500 MCG tablet Take 500 mcg by mouth every evening.     vitamin C (ASCORBIC ACID) 500 MG tablet Take 500 mg by mouth every evening.       STOP taking these medications     hydrochlorothiazide (HYDRODIURIL) 25 MG tablet      Potassium Chloride ER 20 MEQ TBCR      azithromycin (ZITHROMAX) 250 MG tablet        Allergies  Allergen Reactions  . Ibuprofen Other (See Comments)     Stomach irritation   . Iohexol   . Levaquin [Levofloxacin In D5w] Other (See Comments)    Legs hurt   . Vancomycin Other (See Comments)    "red man face"  . Penicillins Rash    Has patient had a PCN reaction causing immediate rash, facial/tongue/throat swelling, SOB or lightheadedness with hypotension: Yes Has patient had a PCN reaction causing severe rash involving mucus membranes or skin necrosis: No Has patient had a PCN reaction that required hospitalization No Has patient had a PCN reaction occurring within the last 10 years: Yes If all of the above answers are "NO", then may proceed with Cephalosporin use.       The results of significant diagnostics from this hospitalization (including  imaging, microbiology, ancillary and laboratory) are listed below for reference.    Significant Diagnostic Studies: Dg Chest 2 View  05/10/2015  CLINICAL DATA:  Shortness of breath. Cough. Chest congestion. Bronchitis. EXAM: CHEST  2 VIEW COMPARISON:  05/09/2015 FINDINGS: Heart size remains at the upper limits of normal. Scarring and surgical staples again seen in the right middle lobe. No evidence of pulmonary infiltrate or edema. No evidence of pleural effusion. Mild hyperinflation again noted. Thoracic spine degenerative changes again seen. IMPRESSION: Borderline cardiomegaly and probable COPD.  No active lung disease. Electronically Signed   By: Myles RosenthalJohn  Stahl M.D.   On: 05/10/2015 16:24   Dg Chest 2 View  05/09/2015  CLINICAL DATA:  Cough, shortness of breath and wheezing today. Initial encounter. EXAM: CHEST  2 VIEW COMPARISON:  CT chest 04/12/2010.  PA and lateral chest 01/29/2014. FINDINGS: The lungs are clear. Heart size is mildly enlarged. No pneumothorax or pleural effusion. Thoracic spondylosis noted. IMPRESSION: No acute disease. Electronically Signed   By: Drusilla Kannerhomas  Dalessio M.D.   On: 05/09/2015 18:01    Microbiology: Recent Results (from the past 240 hour(s))  Culture, blood  (routine x 2)     Status: None (Preliminary result)   Collection Time: 05/10/15  1:30 AM  Result Value Ref Range Status   Specimen Description RIGHT ANTECUBITAL  Final   Special Requests BOTTLES DRAWN AEROBIC AND ANAEROBIC 6CC  Final   Culture NO GROWTH 1 DAY  Final   Report Status PENDING  Incomplete  Culture, blood (routine x 2)     Status: None (Preliminary result)   Collection Time: 05/10/15  1:40 AM  Result Value Ref Range Status   Specimen Description BLOOD RIGHT ARM  Final   Special Requests BOTTLES DRAWN AEROBIC AND ANAEROBIC 6CC  Final   Culture NO GROWTH 1 DAY  Final   Report Status PENDING  Incomplete     Labs: Basic Metabolic Panel:  Recent Labs Lab 05/09/15 1650 05/10/15 0130 05/10/15 0902 05/11/15 0523  NA 142 138 140 143  K 3.6 3.3* 3.9 3.5  CL 106 104 108 113*  CO2 26 18* 20* 20*  GLUCOSE 123* 345* 297* 145*  BUN 24* 24* 23* 21*  CREATININE 1.29* 1.42* 1.25* 0.95  CALCIUM 10.6* 9.5 9.3 8.8*   Liver Function Tests:  Recent Labs Lab 05/09/15 1650  AST 26  ALT 21  ALKPHOS 80  BILITOT 0.6  PROT 8.0  ALBUMIN 4.6   No results for input(s): LIPASE, AMYLASE in the last 168 hours. No results for input(s): AMMONIA in the last 168 hours. CBC:  Recent Labs Lab 05/09/15 1650 05/10/15 0130  WBC 10.6* 8.2  NEUTROABS 6.5  --   HGB 13.7 11.2*  HCT 40.6 33.3*  MCV 81.4 81.4  PLT 286 247   Cardiac Enzymes:  Recent Labs Lab 05/09/15 1731  TROPONINI <0.03   BNP: BNP (last 3 results)  Recent Labs  05/09/15 1650  BNP 42.0    ProBNP (last 3 results) No results for input(s): PROBNP in the last 8760 hours.  CBG: No results for input(s): GLUCAP in the last 168 hours.   Signed:  Erick BlinksJehanzeb Memon, MD.  [facility] 05/11/2015, 11:02 AM  By signing my name below, I, Adron BeneGreylon Gawaluck, attest that this documentation has been prepared under the direction and in the presence of Erick BlinksJehanzeb Memon, MD. Electronically Signed: Adron BeneGreylon  Gawaluck 05/11/2015 10:40am  I, Dr. Erick BlinksJehanzeb Memon, personally performed the services described in this documentaiton. All medical record entries  made by the scribe were at my direction and in my presence. I have reviewed the chart and agree that the record reflects my personal performance and is accurate and complete  Erick Blinks, MD, 05/11/2015 11:02 AM

## 2015-05-11 NOTE — Progress Notes (Signed)
April Bailey discharged Home per MD order.  Discharge instructions reviewed and discussed with the patient, all questions and concerns answered. Copy of instructions and scripts given to patient.    Medication List    STOP taking these medications        azithromycin 250 MG tablet  Commonly known as:  ZITHROMAX     hydrochlorothiazide 25 MG tablet  Commonly known as:  HYDRODIURIL     Potassium Chloride ER 20 MEQ Tbcr      TAKE these medications        albuterol 108 (90 Base) MCG/ACT inhaler  Commonly known as:  PROVENTIL HFA;VENTOLIN HFA  Inhale 2 puffs into the lungs every 6 (six) hours as needed for wheezing or shortness of breath.     amLODipine 10 MG tablet  Commonly known as:  NORVASC  Take 10 mg by mouth every evening.     aspirin EC 81 MG tablet  Take 81 mg by mouth daily.     benazepril 40 MG tablet  Commonly known as:  LOTENSIN  Take 40 mg by mouth every evening.     CALCIUM 1200+D3 PO  Take 1 tablet by mouth every evening.     dorzolamide 2 % ophthalmic solution  Commonly known as:  TRUSOPT  Place 1 drop into both eyes 2 (two) times daily.     FLAX SEED OIL PO  Take 1,000 mg by mouth 2 (two) times daily.     folic acid 800 MCG tablet  Commonly known as:  FOLVITE  Take 800 mcg by mouth every evening.     guaiFENesin 600 MG 12 hr tablet  Commonly known as:  MUCINEX  Take 600 mg by mouth 2 (two) times daily as needed for cough or to loosen phlegm.     KLOR-CON M20 20 MEQ tablet  Generic drug:  potassium chloride SA  Take 10 mEq by mouth every evening. as directed     latanoprost 0.005 % ophthalmic solution  Commonly known as:  XALATAN  Place 1 drop into both eyes at bedtime.     ODORLESS GARLIC 1000 MG Caps  Generic drug:  Garlic Oil  Take 1,000 mg by mouth 2 (two) times daily.     predniSONE 10 MG tablet  Commonly known as:  DELTASONE  Take 40mg  po daily for 2 days then 30mg  daily for 2 days then 20mg  daily for 2 days then 10mg  daily for 2  days then stop     timolol 0.5 % ophthalmic solution  Commonly known as:  TIMOPTIC  Place 1 drop into both eyes every evening.     vitamin B-12 500 MCG tablet  Commonly known as:  CYANOCOBALAMIN  Take 500 mcg by mouth every evening.     vitamin C 500 MG tablet  Commonly known as:  ASCORBIC ACID  Take 500 mg by mouth every evening.        Patients skin is clean, dry and intact, no evidence of skin break down. IV site discontinued and catheter remains intact. Site without signs and symptoms of complications. Dressing and pressure applied.  Patient escorted to car by NT in a wheelchair,  no distress noted upon discharge.  April KoyanagiBonnie Bailey [name] 05/11/2015 3:53 PM

## 2015-05-11 NOTE — Progress Notes (Signed)
Notified MD of lactic acid 2.1 this am. Previous was 4.8.

## 2015-05-11 NOTE — Evaluation (Signed)
Physical Therapy Evaluation Patient Details Name: April Bailey MRN: [id number] DOB: 05/23/1932 Today's Date: 05/11/2015   History of Present Illness  82 yo F admitted due to increased cough, & SOB x2 weeks with possible PNA, and bronchitis.  PMH includes: lung biopsy, thyroid surgery, and L rotator cuff repair.   Clinical Impression  Pt received in bed, and was agreeable to PT evaluation.  Pt demonstrated all functional mobility at Mod (I) or supervision level, however she could have performed these at independent level.  Pt ambulated 462ft and negotiated 3 steps with no AD.  Pt does not demonstrate need for further skilled PT services as she is functioning at baseline and is independent, therefore, will sign off.     Follow Up Recommendations No PT follow up    Equipment Recommendations       Recommendations for Other Services       Precautions / Restrictions Precautions Precautions: None Restrictions Weight Bearing Restrictions: No      Mobility  Bed Mobility Overal bed mobility: Modified Independent                Transfers Overall transfer level: Modified independent (used B UE's to push herself up from the chair. ) Equipment used: None                Ambulation/Gait Ambulation/Gait assistance: Supervision Ambulation Distance (Feet): 400 Feet Assistive device: None Gait Pattern/deviations: WFL(Within Functional Limits)        Stairs Stairs: Yes Stairs assistance: Supervision Stair Management: No rails;One rail Right Number of Stairs: 3 General stair comments: step over step pattern with no difficulty.   Wheelchair Mobility    Modified Rankin (Stroke Patients Only)       Balance Overall balance assessment: Independent                                           Pertinent Vitals/Pain Pain Assessment: No/denies pain    Home Living Family/patient expects to be discharged to:: Private residence Living Arrangements:  Alone   Type of Home: House Home Access: Stairs to enter Entrance Stairs-Rails: Right Entrance Stairs-Number of Steps: 3 Home Layout: Two level;Other (Comment) Home Equipment: Cane - single point      Prior Function Level of Independence: Independent         Comments: Pt states she was still driving, and at church she goes up ~20steps instead of using the elevator because she feels she needs the exercise.       Hand Dominance        Extremity/Trunk Assessment   Upper Extremity Assessment: Overall WFL for tasks assessed           Lower Extremity Assessment: Overall WFL for tasks assessed         Communication   Communication: No difficulties  Cognition Arousal/Alertness: Awake/alert Behavior During Therapy: WFL for tasks assessed/performed Overall Cognitive Status: Within Functional Limits for tasks assessed                      General Comments      Exercises        Assessment/Plan    PT Assessment Patent does not need any further PT services  PT Diagnosis Generalized weakness   PT Problem List    PT Treatment Interventions     PT Goals (Current goals can be found in the  Care Plan section) Acute Rehab PT Goals Patient Stated Goal: [name] hoping to go home later today.  PT Goal Formulation: With patient Time For Goal Achievement: 05/11/15 Potential to Achieve Goals: Good    Frequency     Barriers to discharge        Co-evaluation               End of Session Equipment Utilized During Treatment: Gait belt Activity Tolerance: Patient tolerated treatment well Patient left: in chair;with call bell/phone within reach Nurse Communication: Mobility status    Functional Assessment Tool Used: Clinical Judgement Functional Limitation: Mobility: Walking and moving around Mobility: Walking and Moving Around Current Status [phone](G8978): 0 percent impaired, limited or restricted Mobility: Walking and Moving Around Goal Status [phone](G8979): 0  percent impaired, limited or restricted Mobility: Walking and Moving Around Discharge Status [phone](G8980): 0 percent impaired, limited or restricted    Time: [id number] PT Time Calculation (min) (ACUTE ONLY): 15 min   Charges:   PT Evaluation $PT Eval Low Complexity: 1 Procedure     PT G Codes:   PT G-Codes **NOT FOR INPATIENT CLASS** Functional Assessment Tool Used: Clinical Judgement Functional Limitation: Mobility: Walking and moving around Mobility: Walking and Moving Around Current Status ([id number](G8978): 0 percent impaired, limited or restricted Mobility: Walking and Moving Around Goal Status ([id number](G8979): 0 percent impaired, limited or restricted Mobility: Walking and Moving Around Discharge Status ([id number](G8980): 0 percent impaired, limited or restricted    Beth Margaretha Mahan, PT, DPT X: [phone]   05/11/2015, 10:34 AM

## 2015-05-15 LAB — CULTURE, BLOOD (ROUTINE X 2)
CULTURE: NO GROWTH
CULTURE: NO GROWTH

## 2017-02-11 ENCOUNTER — Emergency Department (HOSPITAL_COMMUNITY)
Admission: EM | Admit: 2017-02-11 | Discharge: 2017-02-12 | Disposition: A | Discharge status: Home / Self Care | Payer: Medicare Other | Attending: Emergency Medicine | Admitting: Emergency Medicine

## 2017-02-11 ENCOUNTER — Other Ambulatory Visit: Payer: Self-pay

## 2017-02-11 ENCOUNTER — Encounter (HOSPITAL_COMMUNITY): Payer: Self-pay

## 2017-02-11 DIAGNOSIS — Z7982 Long term (current) use of aspirin: Secondary | ICD-10-CM | POA: Diagnosis not present

## 2017-02-11 DIAGNOSIS — N183 Chronic kidney disease, stage 3 (moderate): Secondary | ICD-10-CM | POA: Diagnosis not present

## 2017-02-11 DIAGNOSIS — R531 Weakness: Secondary | ICD-10-CM | POA: Diagnosis present

## 2017-02-11 DIAGNOSIS — Z79899 Other long term (current) drug therapy: Secondary | ICD-10-CM | POA: Insufficient documentation

## 2017-02-11 DIAGNOSIS — R0989 Other specified symptoms and signs involving the circulatory and respiratory systems: Secondary | ICD-10-CM | POA: Diagnosis not present

## 2017-02-11 DIAGNOSIS — E860 Dehydration: Secondary | ICD-10-CM | POA: Diagnosis not present

## 2017-02-11 DIAGNOSIS — R21 Rash and other nonspecific skin eruption: Secondary | ICD-10-CM | POA: Insufficient documentation

## 2017-02-11 DIAGNOSIS — R110 Nausea: Secondary | ICD-10-CM | POA: Diagnosis not present

## 2017-02-11 DIAGNOSIS — R2689 Other abnormalities of gait and mobility: Secondary | ICD-10-CM | POA: Diagnosis not present

## 2017-02-11 DIAGNOSIS — N3000 Acute cystitis without hematuria: Secondary | ICD-10-CM | POA: Diagnosis not present

## 2017-02-11 DIAGNOSIS — I129 Hypertensive chronic kidney disease with stage 1 through stage 4 chronic kidney disease, or unspecified chronic kidney disease: Secondary | ICD-10-CM | POA: Insufficient documentation

## 2017-02-11 HISTORY — DX: Zoster without complications: B02.9

## 2017-02-11 LAB — COMPREHENSIVE METABOLIC PANEL
ALBUMIN: 3.9 g/dL (ref 3.5–5.0)
ALT: 19 U/L (ref 14–54)
ANION GAP: 15 (ref 5–15)
AST: 26 U/L (ref 15–41)
Alkaline Phosphatase: 72 U/L (ref 38–126)
BUN: 28 mg/dL — ABNORMAL HIGH (ref 6–20)
CALCIUM: 10.9 mg/dL — AB (ref 8.9–10.3)
CHLORIDE: 104 mmol/L (ref 101–111)
CO2: 21 mmol/L — AB (ref 22–32)
Creatinine, Ser: 1.27 mg/dL — ABNORMAL HIGH (ref 0.44–1.00)
GFR calc non Af Amer: 38 mL/min — ABNORMAL LOW (ref >60)
GFR, EST AFRICAN AMERICAN: 44 mL/min — AB (ref >60)
GLUCOSE: 147 mg/dL — AB (ref 65–99)
POTASSIUM: 4 mmol/L (ref 3.5–5.1)
SODIUM: 140 mmol/L (ref 135–145)
Total Bilirubin: 0.7 mg/dL (ref 0.3–1.2)
Total Protein: 7.5 g/dL (ref 6.5–8.1)

## 2017-02-11 LAB — CBC
HEMATOCRIT: 37.9 % (ref 36.0–46.0)
HEMOGLOBIN: 12.1 g/dL (ref 12.0–15.0)
MCH: 26.1 pg (ref 26.0–34.0)
MCHC: 31.9 g/dL (ref 30.0–36.0)
MCV: 81.7 fL (ref 78.0–100.0)
Platelets: 231 10*3/uL (ref 150–400)
RBC: 4.64 MIL/uL (ref 3.87–5.11)
RDW: 15.3 % (ref 11.5–15.5)
WBC: 8.8 10*3/uL (ref 4.0–10.5)

## 2017-02-11 LAB — URINALYSIS, ROUTINE W REFLEX MICROSCOPIC
BILIRUBIN URINE: NEGATIVE
Glucose, UA: NEGATIVE mg/dL
Hgb urine dipstick: NEGATIVE
KETONES UR: 20 mg/dL — AB
Nitrite: NEGATIVE
PH: 5 (ref 5.0–8.0)
PROTEIN: NEGATIVE mg/dL
Specific Gravity, Urine: 1.014 (ref 1.005–1.030)

## 2017-02-11 LAB — LIPASE, BLOOD: LIPASE: 44 U/L (ref 11–51)

## 2017-02-11 MED ORDER — GABAPENTIN 100 MG PO CAPS: 100.0000 mg | ORAL_CAPSULE | Freq: Three times a day (TID) | ORAL | 0 refills | Status: AC

## 2017-02-11 MED ORDER — CEPHALEXIN 500 MG PO CAPS
500.0000 mg | ORAL_CAPSULE | Freq: Once | ORAL | Status: DC
  Filled 2017-02-11: qty 1

## 2017-02-11 MED ORDER — HYDROCODONE-ACETAMINOPHEN 5-325 MG PO TABS: 1.0000 | ORAL_TABLET | Freq: Four times a day (QID) | ORAL | 0 refills | Status: AC | PRN

## 2017-02-11 MED ORDER — FENTANYL CITRATE (PF) 100 MCG/2ML IJ SOLN
50.0000 ug | Freq: Once | INTRAMUSCULAR | Status: AC
  Administered 2017-02-11: 50 ug via INTRAVENOUS
  Filled 2017-02-11: qty 2

## 2017-02-11 MED ORDER — SODIUM CHLORIDE 0.9 % IV BOLUS (SEPSIS)
1000.0000 mL | Freq: Once | INTRAVENOUS | Status: AC
  Administered 2017-02-11: 1000 mL via INTRAVENOUS

## 2017-02-11 MED ORDER — CEPHALEXIN 500 MG PO CAPS: ORAL_CAPSULE | ORAL | 0 refills | Status: AC

## 2017-02-11 MED ORDER — GABAPENTIN 100 MG PO CAPS
100.0000 mg | ORAL_CAPSULE | Freq: Once | ORAL | Status: AC
  Administered 2017-02-11: 100 mg via ORAL
  Filled 2017-02-11: qty 1

## 2017-02-11 NOTE — ED Triage Notes (Addendum)
Patient reports of fatigue, nausea and lightheaded when changing positions x 3-4 days. Is currently being treated for shingles. States she has had a flare up in back since the day after Christmas, was suppose to have injection in back but unable to due to shingles.  Patient has difficulty ambulating which is new per family.

## 2017-02-11 NOTE — ED Provider Notes (Signed)
[facility] EMERGENCY DEPARTMENT Provider Note   CSN: [id number] Arrival date & time: 02/11/17  1748     History   Chief Complaint Chief Complaint  Patient presents with  . Nausea  . Rash    HPI April Bailey is a 84 y.o. female.   Weakness  Primary symptoms include loss of balance.  Primary symptoms include no focal weakness, no speech change, no memory loss, no movement disorder. This is a new problem. The current episode started more than 2 days ago. The problem has been gradually worsening. There was no focality noted. There has been no fever. Pertinent negatives include no shortness of breath, no confusion and no headaches. Associated medical issues do not include trauma.   Patient has had chronic back problems for the last month and has been seen in an orthopedic surgeon who stated that she had multiple bulging disks.  Is also been seeing pain management.  He also diagnosed with shingles in the last few weeks for which she had acyclovir, prednisone and Neurontin.  She has persistent pain in her leg and she thinks is because of a decreased appetite so she is gotten dehydrated and now she feels nausea and then she also has dizziness every time she stands up.  No falls.  Rashes improving.  Past Medical History:  Diagnosis Date  . Glaucoma   . Hypertension   . Kidney stones   . Shingles     Patient Active Problem List   Diagnosis Date Noted  . Acute bronchospasm 05/09/2015  . Bronchitis 05/09/2015  . CKD (chronic kidney disease) stage 3, GFR 30-59 ml/min (HCC) 05/09/2015  . Essential hypertension 05/09/2015  . Hypercalcemia 05/09/2015    Past Surgical History:  Procedure Laterality Date  . ABDOMINAL HYSTERECTOMY    . BIOPSY THYROID    . CATARACT EXTRACTION    . CHOLECYSTECTOMY    . CYST EXCISION    . HEMORROIDECTOMY    . kidney stones    . LUNG BIOPSY Right   . PARTIAL HYSTERECTOMY    . ROTATOR CUFF REPAIR    . THYROID SURGERY    . VEIN SURGERY      OB  History    Gravida Para Term Preterm AB Living   2 2 2     2    SAB TAB Ectopic Multiple Live Births                   Home Medications    Prior to Admission medications   Medication Sig Start Date End Date Taking? Authorizing Provider  acyclovir (ZOVIRAX) 800 MG tablet Take 800 mg by mouth 3 (three) times daily. 10 day course starting on 02/06/2017 02/06/17  Yes [provider]  albuterol (PROVENTIL HFA;VENTOLIN HFA) 108 (90 Base) MCG/ACT inhaler Inhale 2 puffs into the lungs every 6 (six) hours as needed for wheezing or shortness of breath. 05/11/15  Yes Memon, Durward Mallard, MD  amLODipine (NORVASC) 10 MG tablet Take 10 mg by mouth every evening.  12/08/13  Yes [provider]  aspirin EC 81 MG tablet Take 81 mg by mouth daily.   Yes [provider]  benazepril (LOTENSIN) 40 MG tablet Take 40 mg by mouth every evening.  12/08/13  Yes [provider]  Calcium-Magnesium-Vitamin D (CALCIUM 1200+D3 PO) Take 1 tablet by mouth every evening.   Yes [provider]  dorzolamide (TRUSOPT) 2 % ophthalmic solution Place 1 drop into both eyes 2 (two) times daily. 05/02/15  Yes [provider]  Flaxseed, Linseed, (FLAX SEED OIL PO) Take 1,000 mg by mouth 2 (two) times daily.    Yes [provider]  folic acid (FOLVITE) 800 MCG tablet Take 800 mcg by mouth every evening.    Yes [provider]  Garlic Oil (ODORLESS GARLIC) 1000 MG CAPS Take 1,000 mg by mouth 2 (two) times daily.   Yes [provider]  hydrochlorothiazide (HYDRODIURIL) 25 MG tablet Take 25 mg by mouth daily.   Yes [provider]  KLOR-CON M20 20 MEQ tablet Take 10 mEq by mouth every evening. as directed 11/29/13  Yes [provider]  latanoprost (XALATAN) 0.005 % ophthalmic solution Place 1 drop into both eyes at bedtime. 04/28/15  Yes [provider]  timolol (TIMOPTIC) 0.5 % ophthalmic solution Place 1 drop into both eyes every evening.  12/17/13  Yes [provider]  vitamin B-12 (CYANOCOBALAMIN) 500 MCG tablet Take 500 mcg by mouth every evening.    Yes [provider]  vitamin C (ASCORBIC ACID) 500 MG tablet Take 500 mg by mouth every evening.    Yes [provider]  cephALEXin (KEFLEX) 500 MG capsule 2 caps po bid x 7 days 02/11/17   Omeka Holben, Barbara CowerJason, MD  gabapentin (NEURONTIN) 100 MG capsule Take 1 capsule (100 mg total) by mouth 3 (three) times daily. 02/11/17   Zymiere Trostle, Barbara CowerJason, MD  HYDROcodone-acetaminophen (NORCO/VICODIN) 5-325 MG tablet Take 1 tablet by mouth every 6 (six) hours as needed for severe pain. 02/11/17   Ronell Duffus, Barbara CowerJason, MD    Family History Family History  Problem Relation Age of Onset  . Anuerysm Father     Social History Social History   Tobacco Use  . Smoking status: Never Smoker  . Smokeless tobacco: Never Used  Substance Use Topics  . Alcohol use: No  . Drug use: No     Allergies   Ibuprofen; Iohexol; Levaquin [levofloxacin in d5w]; Vancomycin; and Penicillins   Review of Systems Review of Systems  Respiratory: Negative for shortness of breath.   Neurological: Positive for weakness and loss of balance. Negative for speech change, focal weakness and headaches.  Psychiatric/Behavioral: Negative for confusion and memory loss.  All other systems reviewed and are negative.    Physical Exam Updated Vital Signs BP 107/62   Pulse 74   Temp 98.3 F (36.8 C) (Oral)   Resp 20   Ht 5\' 3"  (1.6 m)   Wt 65.8 kg (145 lb)   SpO2 93%   BMI 25.69 kg/m   Physical Exam  Constitutional: She appears well-developed and well-nourished.  HENT:  Head: Normocephalic and atraumatic.  Eyes: Conjunctivae and EOM are normal.  Neck: Normal range of motion.  Cardiovascular: Normal rate and regular rhythm.  No murmur heard. Left DP pulse not palpable but is dopplerable.  Right DP is bounding.  Normal bilateral PT pulses.   Pulmonary/Chest: Effort normal and breath sounds normal.  No stridor. No respiratory distress.  Abdominal: Soft. Bowel sounds are normal. She exhibits no distension.  Musculoskeletal: Normal range of motion. She exhibits no edema or deformity.  Neurological: She is alert.  Skin: Skin is warm and dry.  Nursing note and vitals reviewed.    ED Treatments / Results  Labs (all labs ordered are listed, but only abnormal results are displayed) Labs Reviewed  COMPREHENSIVE METABOLIC PANEL - Abnormal; Notable for the following components:      Result Value   CO2 21 (*)    Glucose, Bld 147 (*)  BUN 28 (*)    Creatinine, Ser 1.27 (*)    Calcium 10.9 (*)    GFR calc non Af Amer 38 (*)    GFR calc Af Amer 44 (*)    All other components within normal limits  URINALYSIS, ROUTINE W REFLEX MICROSCOPIC - Abnormal; Notable for the following components:   APPearance HAZY (*)    Ketones, ur 20 (*)    Leukocytes, UA LARGE (*)    Bacteria, UA MANY (*)    Squamous Epithelial / LPF 6-30 (*)    All other components within normal limits  URINE CULTURE  LIPASE, BLOOD  CBC    EKG  EKG Interpretation  Date/Time:  "Sunday February 11 2017 20:28:36 EST Ventricular Rate:  76 PR Interval:    QRS Duration: 91 QT Interval:  401 QTC Calculation: 451 R Axis:   -52 Text Interpretation:  Sinus rhythm Left anterior fascicular block New TWI in III and flattening in aVF  since april 2017 Confirmed by Margy Sumler ([id number]) on 02/11/2017 8:41:34 PM       Radiology No results found.  Procedures Procedures (including critical care time)  Medications Ordered in ED Medications  cephALEXin (KEFLEX) capsule 500 mg (not administered)  sodium chloride 0.9 % bolus 1,000 mL (0 mLs Intravenous Stopped 02/11/17 2249)  fentaNYL (SUBLIMAZE) injection 50 mcg (50 mcg Intravenous Given 02/11/17 2014)  gabapentin (NEURONTIN) capsule 100 mg (100 mg Oral Given 02/11/17 2013)     Initial Impression / Assessment and Plan / ED Course  I have reviewed the triage vital signs and  the nursing notes.  Pertinent labs & imaging results that were available during my care of the patient were reviewed by me and considered in my medical decision making (see chart for details).     Patient's primary complaint is nausea weakness likely secondary to dehydration and urinary tract infection.  Patient was rehydrated and says she feels better but still little bit weak.  She feels like she is okay to go home.  I will have care management try to contact her for physical therapy evaluation. She also has was diagnosed as shingles in her left leg so increase her Neurontin as he does not seem to be helping and given her short course of pain medication on top of that.  She will continue following with her doctor, orthopedics and pain management for the same. Of note she has a diminished left pulse compared to the right.  It is dopplerable but not palpable and is much different than it is in the right.  She has a normal posterior tibial pulse on the left and the right.  Her left foot is well perfused, warm but she does seem to have some symptoms of claudication so I will ask her to follow-up with vascular surgery for Doppler study and further evaluation.  Final Clinical Impressions(s) / ED Diagnoses   Final diagnoses:  Acute cystitis without hematuria  Dehydration  Diminished pulse    ED Discharge Orders        Ordered    cephALEXin (KEFLEX) 500 MG capsule     02/11/17 2252    gabapentin (NEURONTIN) 100 MG capsule  3 times daily     02/11/17 2252    HYDROcodone-acetaminophen (NORCO/VICODIN) 5-325 MG tablet  Every 6 hours PRN     01" /27/19 2253       Kendrick Haapala, Barbara Cower, MD 02/12/17 0005

## 2017-02-14 LAB — URINE CULTURE

## 2017-02-15 ENCOUNTER — Telehealth: Payer: Self-pay | Admitting: *Deleted

## 2017-02-15 NOTE — Telephone Encounter (Signed)
Post ED Visit - Positive Culture Follow-up  Culture report reviewed by antimicrobial stewardship pharmacist:  []  Enzo BiNathan Batchelder, Pharm.D. []  Celedonio MiyamotoJeremy Frens, Pharm.D., BCPS AQ-ID [x]  Garvin FilaMike Maccia, Pharm.D., BCPS []  Georgina PillionElizabeth Martin, Pharm.D., BCPS []  FoxMinh Pham, Vermont[place]arm.D., BCPS, AAHIVP []  Estella HuskMichelle Turner, Pharm.D., BCPS, AAHIVP []  Lysle Pearlachel Rumbarger, PharmD, BCPS []  Blake DivineShannon Parkey, PharmD []  Pollyann SamplesAndy Johnston, PharmD, BCPS  Positive urine culture Treated with Cephalexin, organism sensitive to the same and no further patient follow-up is required at this time.  Virl AxeRobertson, [place] [facility] 02/15/2017, 10:56 AM

## 2017-10-05 IMAGING — DX DG CHEST 2V
2 series · 2 of 2 positions shown · non-contrast
Comparison: CT chest 04/12/2010.  PA and lateral chest 01/29/2014.

CLINICAL DATA: Cough, shortness of breath and wheezing today.
Initial encounter.

EXAM:
CHEST  2 VIEW

[chest lat]
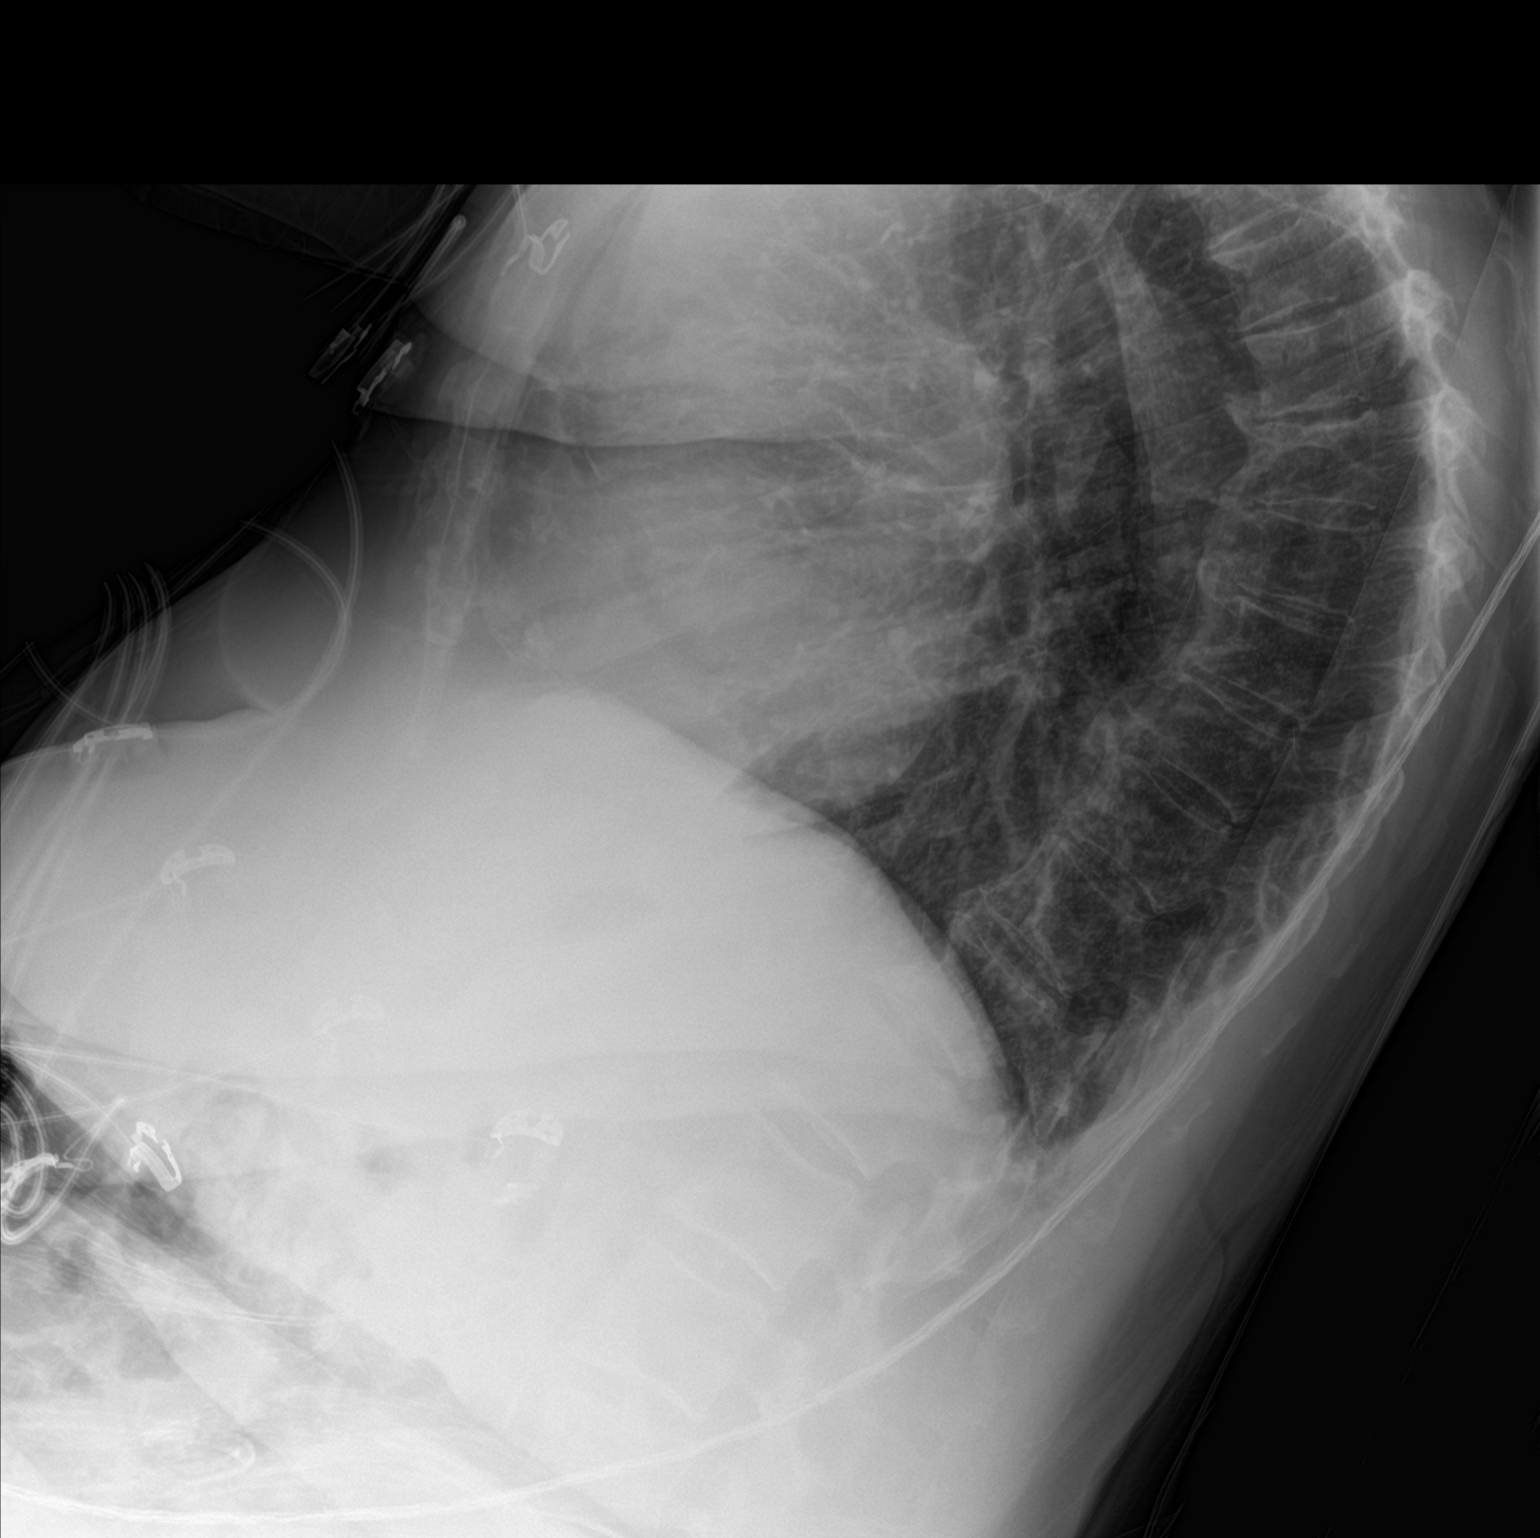

[chest ap]
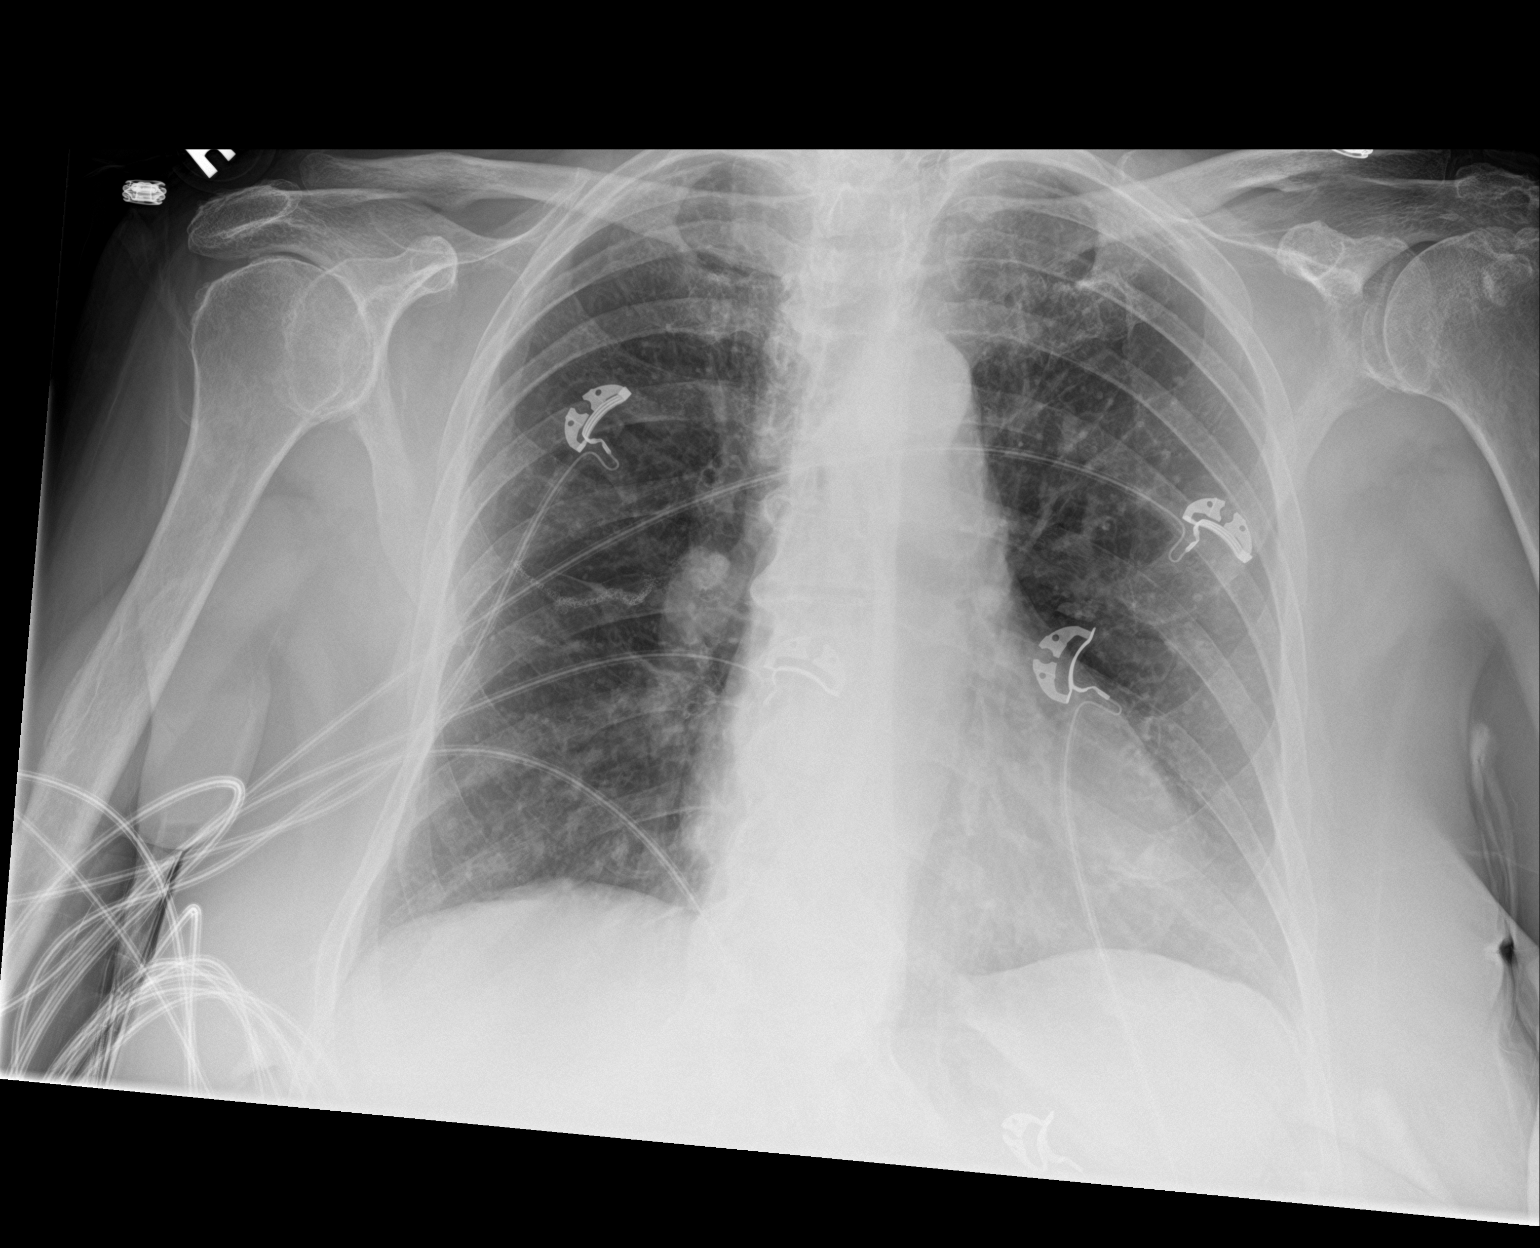

[2 of 2 positions shown; findings below may reference images not displayed]

FINDINGS: The lungs are clear. Heart size is mildly enlarged. No pneumothorax
or pleural effusion. Thoracic spondylosis noted.
IMPRESSION: No acute disease.

## 2017-10-06 IMAGING — DX DG CHEST 2V
2 series · 2 of 2 positions shown · non-contrast
Comparison: 05/09/2015

CLINICAL DATA: Shortness of breath. Cough. Chest congestion.
Bronchitis.

EXAM:
CHEST  2 VIEW

[chest pa]
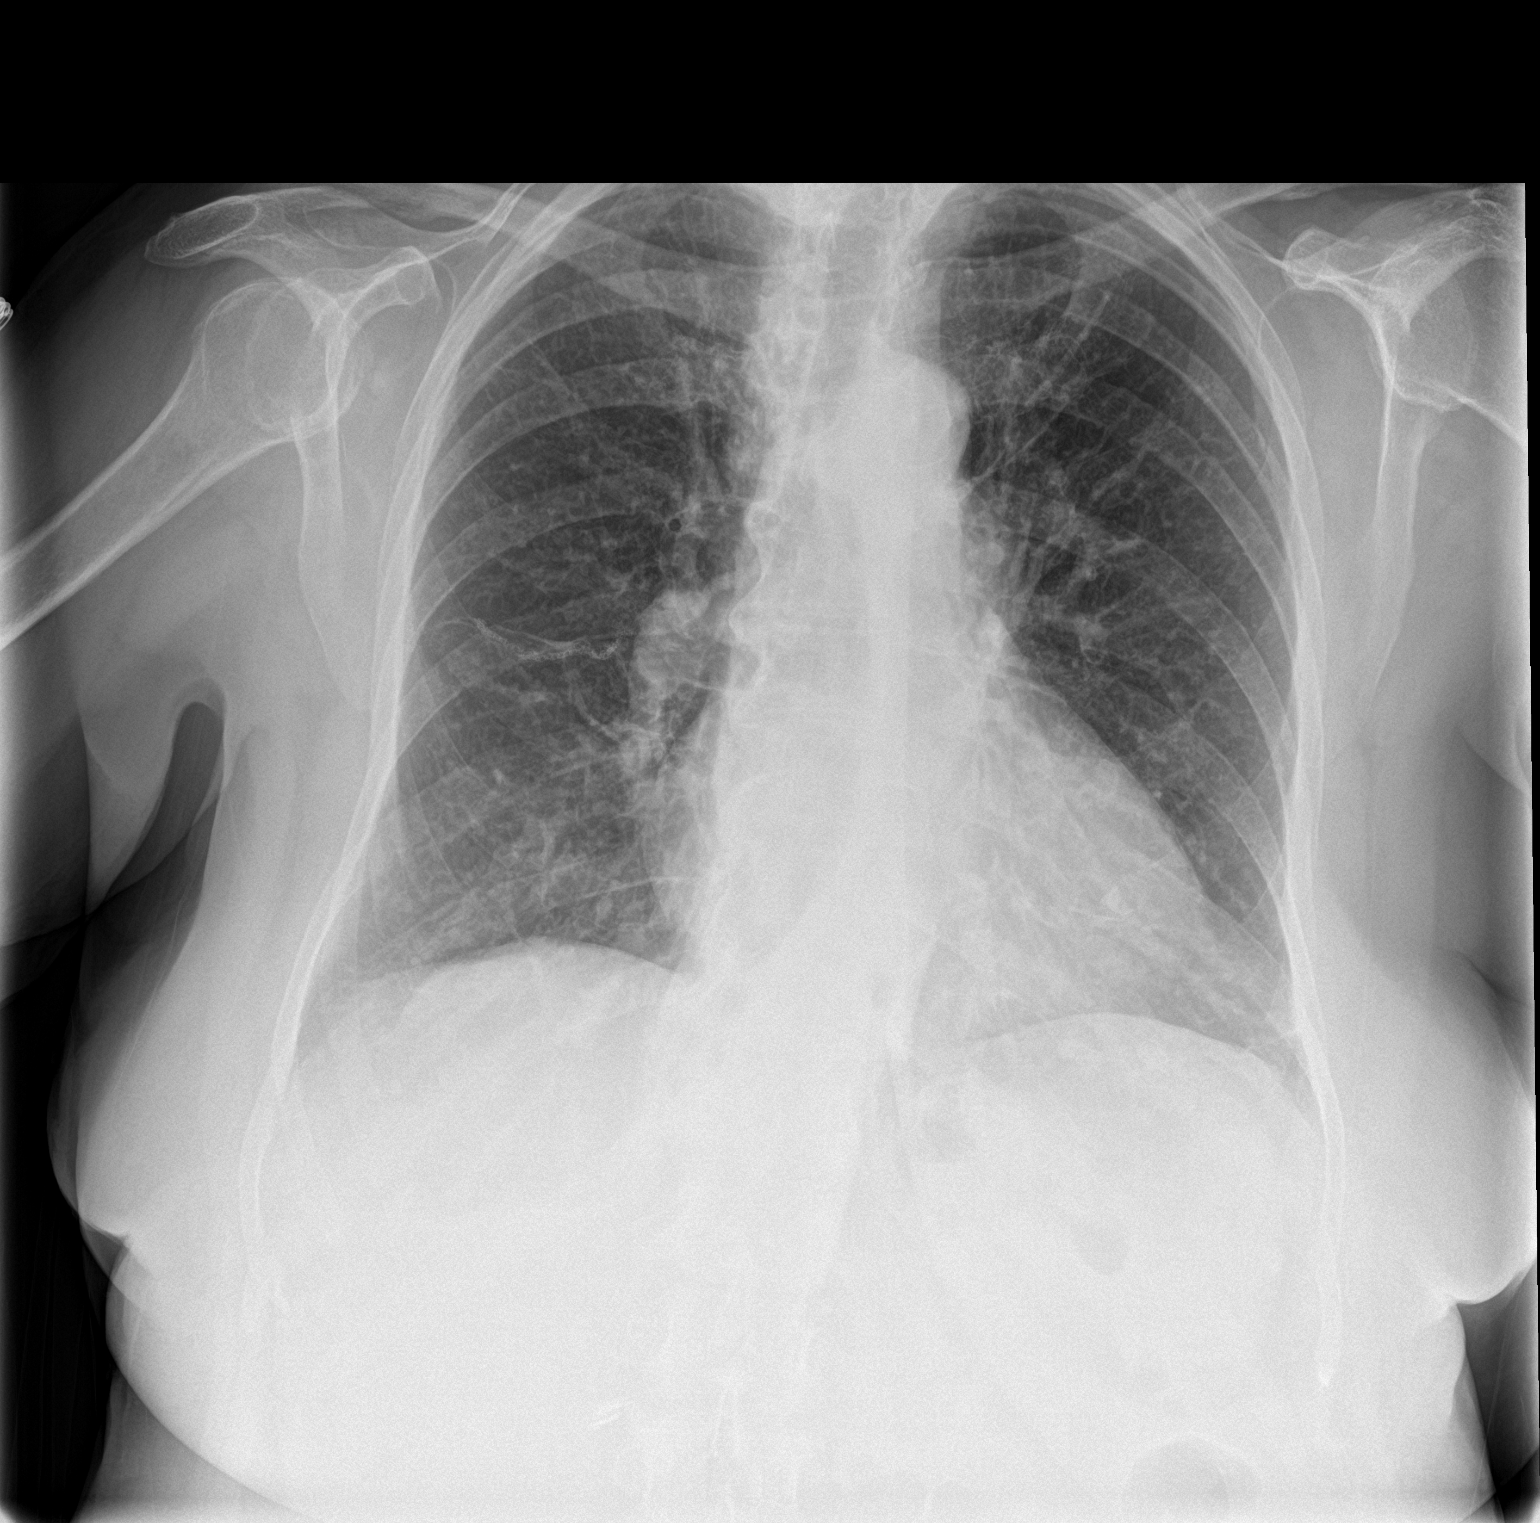

[chest lat]
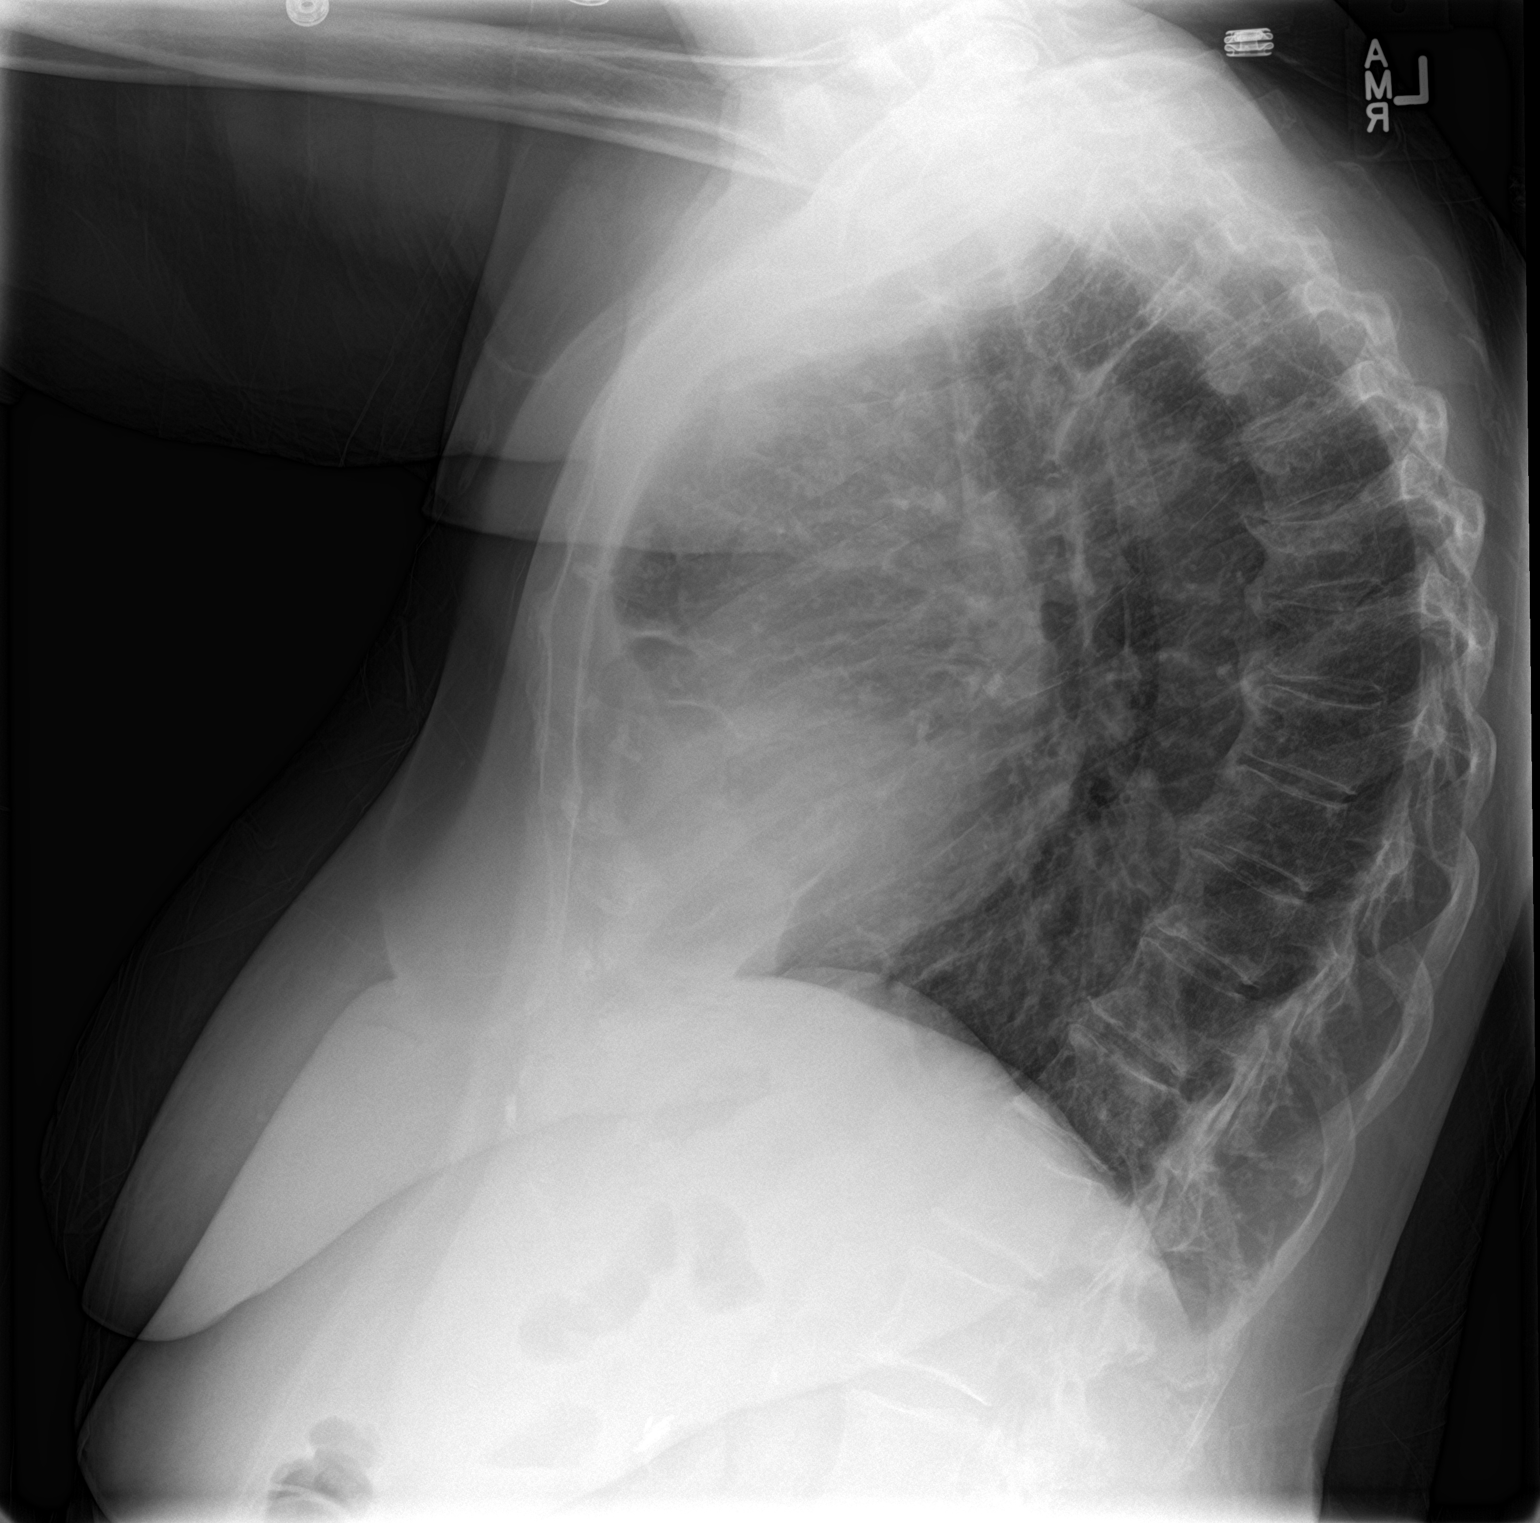

[2 of 2 positions shown; findings below may reference images not displayed]

FINDINGS: Heart size remains at the upper limits of normal. Scarring and
surgical staples again seen in the right middle lobe. No evidence of
pulmonary infiltrate or edema. No evidence of pleural effusion. Mild
hyperinflation again noted. Thoracic spine degenerative changes
again seen.
IMPRESSION: Borderline cardiomegaly and probable COPD.  No active lung disease.
